# Patient Record
Sex: Female | Born: 1960 | Race: White | Hispanic: No | Smoking: Never smoker
Health system: Southern US, Community
[De-identification: ages and names within clinical notes are randomized; demographics above are authoritative.]

## PROBLEM LIST (undated history)

## (undated) DIAGNOSIS — R7303 Prediabetes: Secondary | ICD-10-CM

## (undated) DIAGNOSIS — K219 Gastro-esophageal reflux disease without esophagitis: Secondary | ICD-10-CM

## (undated) DIAGNOSIS — I10 Essential (primary) hypertension: Secondary | ICD-10-CM

## (undated) DIAGNOSIS — E079 Disorder of thyroid, unspecified: Secondary | ICD-10-CM

## (undated) DIAGNOSIS — R011 Cardiac murmur, unspecified: Secondary | ICD-10-CM

## (undated) DIAGNOSIS — R0600 Dyspnea, unspecified: Secondary | ICD-10-CM

## (undated) DIAGNOSIS — M1990 Unspecified osteoarthritis, unspecified site: Secondary | ICD-10-CM

## (undated) DIAGNOSIS — T7840XA Allergy, unspecified, initial encounter: Secondary | ICD-10-CM

## (undated) HISTORY — DX: Unspecified osteoarthritis, unspecified site: M19.90

## (undated) HISTORY — DX: Essential (primary) hypertension: I10

## (undated) HISTORY — PX: TONSILLECTOMY AND ADENOIDECTOMY: SHX28

## (undated) HISTORY — DX: Disorder of thyroid, unspecified: E07.9

## (undated) HISTORY — DX: Allergy, unspecified, initial encounter: T78.40XA

## (undated) HISTORY — PX: THYROIDECTOMY: SHX17

---

## 1999-05-12 ENCOUNTER — Other Ambulatory Visit: Admission: RE | Admit: 1999-05-12 | Discharge: 1999-05-12 | Payer: Self-pay | Admitting: Obstetrics and Gynecology

## 1999-06-07 ENCOUNTER — Emergency Department (HOSPITAL_COMMUNITY): Admission: EM | Admit: 1999-06-07 | Discharge: 1999-06-08 | Payer: Self-pay | Admitting: Emergency Medicine

## 2000-05-28 ENCOUNTER — Other Ambulatory Visit: Admission: RE | Admit: 2000-05-28 | Discharge: 2000-05-28 | Payer: Self-pay | Admitting: Obstetrics and Gynecology

## 2001-06-03 ENCOUNTER — Other Ambulatory Visit: Admission: RE | Admit: 2001-06-03 | Discharge: 2001-06-03 | Payer: Self-pay | Admitting: Obstetrics and Gynecology

## 2002-06-04 ENCOUNTER — Other Ambulatory Visit: Admission: RE | Admit: 2002-06-04 | Discharge: 2002-06-04 | Payer: Self-pay | Admitting: Obstetrics and Gynecology

## 2003-06-22 ENCOUNTER — Other Ambulatory Visit: Admission: RE | Admit: 2003-06-22 | Discharge: 2003-06-22 | Payer: Self-pay | Admitting: Obstetrics and Gynecology

## 2006-10-23 ENCOUNTER — Ambulatory Visit: Payer: Self-pay | Admitting: Internal Medicine

## 2006-10-23 LAB — CONVERTED CEMR LAB
ALT: 24 units/L (ref 0–40)
Albumin: 3.2 g/dL — ABNORMAL LOW (ref 3.5–5.2)
Alkaline Phosphatase: 47 units/L (ref 39–117)
BUN: 6 mg/dL (ref 6–23)
Basophils Absolute: 0 10*3/uL (ref 0.0–0.1)
Bilirubin Urine: NEGATIVE
Bilirubin, Direct: 0.1 mg/dL (ref 0.0–0.3)
CO2: 30 meq/L (ref 19–32)
Chloride: 106 meq/L (ref 96–112)
Cholesterol: 211 mg/dL (ref 0–200)
GFR calc Af Amer: 138 mL/min
GFR calc non Af Amer: 114 mL/min
Glucose, Bld: 104 mg/dL — ABNORMAL HIGH (ref 70–99)
HDL: 50.8 mg/dL (ref >39.0)
Hemoglobin, Urine: NEGATIVE
Hemoglobin: 13.7 g/dL (ref 12.0–15.0)
Ketones, ur: NEGATIVE mg/dL
Monocytes Relative: 7 % (ref 3.0–11.0)
Neutro Abs: 3.7 10*3/uL (ref 1.4–7.7)
Neutrophils Relative %: 54.2 % (ref 43.0–77.0)
Nitrite: NEGATIVE
Platelets: 404 10*3/uL — ABNORMAL HIGH (ref 150–400)
RBC: 4.7 M/uL (ref 3.87–5.11)
RDW: 12.2 % (ref 11.5–14.6)
Specific Gravity, Urine: 1.01 (ref 1.000–1.03)
Total Bilirubin: 0.6 mg/dL (ref 0.3–1.2)
Total Protein: 6.6 g/dL (ref 6.0–8.3)
Urine Glucose: NEGATIVE mg/dL
Urobilinogen, UA: 0.2 (ref 0.0–1.0)
VLDL: 32 mg/dL (ref 0–40)

## 2006-10-30 ENCOUNTER — Other Ambulatory Visit: Admission: RE | Admit: 2006-10-30 | Discharge: 2006-10-30 | Payer: Self-pay | Admitting: Internal Medicine

## 2006-10-30 ENCOUNTER — Encounter: Payer: Self-pay | Admitting: Internal Medicine

## 2006-10-30 ENCOUNTER — Ambulatory Visit: Payer: Self-pay | Admitting: Internal Medicine

## 2006-12-14 ENCOUNTER — Ambulatory Visit (HOSPITAL_COMMUNITY): Admission: RE | Admit: 2006-12-14 | Discharge: 2006-12-14 | Payer: Self-pay | Admitting: Family Medicine

## 2006-12-14 ENCOUNTER — Ambulatory Visit: Payer: Self-pay | Admitting: Family Medicine

## 2006-12-31 ENCOUNTER — Ambulatory Visit: Payer: Self-pay | Admitting: Internal Medicine

## 2007-05-12 ENCOUNTER — Telehealth: Payer: Self-pay | Admitting: Internal Medicine

## 2007-05-12 DIAGNOSIS — E041 Nontoxic single thyroid nodule: Secondary | ICD-10-CM | POA: Insufficient documentation

## 2007-05-26 ENCOUNTER — Encounter: Payer: Self-pay | Admitting: Internal Medicine

## 2007-06-02 ENCOUNTER — Telehealth: Payer: Self-pay | Admitting: Internal Medicine

## 2007-09-22 ENCOUNTER — Telehealth: Payer: Self-pay | Admitting: Internal Medicine

## 2007-09-30 ENCOUNTER — Encounter: Payer: Self-pay | Admitting: Internal Medicine

## 2007-10-07 ENCOUNTER — Telehealth (INDEPENDENT_AMBULATORY_CARE_PROVIDER_SITE_OTHER): Payer: Self-pay | Admitting: *Deleted

## 2007-11-24 ENCOUNTER — Encounter: Payer: Self-pay | Admitting: Internal Medicine

## 2007-11-26 ENCOUNTER — Telehealth: Payer: Self-pay | Admitting: Internal Medicine

## 2007-12-09 ENCOUNTER — Encounter: Payer: Self-pay | Admitting: Internal Medicine

## 2007-12-14 ENCOUNTER — Encounter: Payer: Self-pay | Admitting: Internal Medicine

## 2007-12-16 ENCOUNTER — Other Ambulatory Visit: Admission: RE | Admit: 2007-12-16 | Discharge: 2007-12-16 | Payer: Self-pay | Admitting: Family Medicine

## 2008-01-21 ENCOUNTER — Encounter: Admission: RE | Admit: 2008-01-21 | Discharge: 2008-01-21 | Payer: Self-pay | Admitting: Internal Medicine

## 2008-03-03 ENCOUNTER — Encounter (INDEPENDENT_AMBULATORY_CARE_PROVIDER_SITE_OTHER): Payer: Self-pay | Admitting: Family Medicine

## 2008-03-03 ENCOUNTER — Ambulatory Visit: Admission: RE | Admit: 2008-03-03 | Discharge: 2008-03-03 | Payer: Self-pay | Admitting: Family Medicine

## 2008-03-03 ENCOUNTER — Ambulatory Visit: Payer: Self-pay | Admitting: *Deleted

## 2008-07-21 ENCOUNTER — Encounter: Admission: RE | Admit: 2008-07-21 | Discharge: 2008-07-21 | Payer: Self-pay | Admitting: Internal Medicine

## 2008-12-23 ENCOUNTER — Other Ambulatory Visit: Admission: RE | Admit: 2008-12-23 | Discharge: 2008-12-23 | Payer: Self-pay | Admitting: Family Medicine

## 2009-09-27 ENCOUNTER — Encounter: Admission: RE | Admit: 2009-09-27 | Discharge: 2009-09-27 | Payer: Self-pay | Admitting: Internal Medicine

## 2009-12-27 ENCOUNTER — Other Ambulatory Visit: Admission: RE | Admit: 2009-12-27 | Discharge: 2009-12-27 | Payer: Self-pay | Admitting: Family Medicine

## 2010-05-28 ENCOUNTER — Encounter: Payer: Self-pay | Admitting: Internal Medicine

## 2010-09-19 NOTE — Assessment & Plan Note (Signed)
[facility]                                 ON-CALL NOTE   NAME:SCHULTZGennell, How                         MRN:          [id number]  DATE:12/14/2006                            DOB:          13-Sep-1960    I was able to contact Ms. Orama this evening, and advise  her  of her  results regarding  the MRI of the neck.  Patient was advised to keep up  the appointment with Dr. Debby Bud on Thursday.  She is to follow up sooner  if she has any problems.  Patient states that she anticipates  rescheduling the appointment due to some conflict with her job, but she  will follow up at her earliest convenience.     Leanne Chang, M.D.  Electronically Signed    LA/MedQ  DD: 12/14/2006  DT: 12/15/2006  Job #: [id number]

## 2010-09-19 NOTE — Assessment & Plan Note (Signed)
[facility]                                 ON-CALL NOTE   NAME:SCHULTZSheyli, Horwitz                       MRN:          [id number]  DATE:12/14/2006                            DOB:          [birth year]    PRIMARY CARE PHYSICIAN:  Dr. Debby Bud.   Ms. Surles is a patient that I saw during the Saturday clinic, please  see the note for further details. She was noted to have a mass on the  left side of her neck. She does have a history of thyroid goiter, but  MRI was ordered to rule out any other pathology. I called [facility] and was able to obtain to radiology report that was read by  Dr. Pecolia Ades. The report was read to me by Brett Canales at radiology and it  stated the following: Thyroid goiter with small scattered lymph node,  but no abscess, cellulitis, mass noted. Mild cervical degenerative  changes.   I will attempt to contact Ms. Behl to advise her of the MRI report.  We will have her follow up as scheduled next week with Dr. Debby Bud. She  is to follow up sooner if she has any significant discomfort or  worsening symptoms.     Leanne Chang, M.D.  Electronically Signed    LA/MedQ  DD: 12/14/2006  DT: 12/15/2006  Job #: [id number]

## 2010-09-19 NOTE — Assessment & Plan Note (Signed)
[facility]                           PRIMARY CARE OFFICE NOTE   NAME:Tara Hill, Tara Hill                       MRN:          [id number]  DATE:10/30/2006                            DOB:          07/10/1960    REASON FOR VISIT:  Tara Hill is a 50 year old woman who presents to  establish for ongoing continuity of care.  She has no active complaints  at today's visit and wishes a general physical examination.   PAST MEDICAL HISTORY:  1. Patient has had the usual childhood diseases.  2. Hay fever and allergies to dust, mold and cat dander.  3. Heart murmur with normal 2-dimensional echocardiogram in 2002.  4. Hypertension.  5. Menstrual headaches, averaging two per year.  6. Hyperthyroid disease.   PAST SURGICAL HISTORY:  Tonsillectomy, remote.  No other surgeries  noted.   GYN HISTORY:  Patient is a gravida 0, para 0.   CURRENT MEDICATIONS:  1. Yasmin daily.  2. Hydrochlorothiazide 25 mg daily.  3. Zyrtec p.r.n.  4. Protonix 40 mg p.r.n.  5. Axert 12.5 mg p.r.n. headaches.  6. Skelaxin 800 mg as needed.   FAMILY HISTORY:  Alcoholism in her father who is deceased.  Father also  with arthritis.  Mother with lymphoma, deceased.  Two brothers in good  health, one sister with fibroid tumors, one great aunt with breast  cancer.  Secondary kinship positive for diabetes, hypertension, stroke  and heart disease.   SOCIAL HISTORY:  The patient has had two years of college.  She is a  Estate agent.  She was married for four years, divorced and  remains single.  She has no children.   HABITS:  The patient's hobbies include reading, making cards.  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  Tobacco:  None.  Alcohol:  None.   REVIEW OF SYSTEMS:  Negative for any CONSTITUTIONAL, CARDIOVASCULAR,  RESPIRATORY, GI or GU problems.   PHYSICAL EXAMINATION:  VITAL SIGNS:  Temperature 97.2, blood pressure  136/94, pulse 100, weight 199.  GENERAL APPEARANCE:  Heavy-set, Caucasian woman who looks her stated age  in no acute distress.  HEENT:  Normocephalic, atraumatic.  EAC's and tympanic membranes were  unremarkable.  Oropharynx with native dentition in good repair with  braces intact.  Posterior pharynx was clear.  Buccal membranes were  clear.  Conjunctivae and sclerae were clear. Pupils equal, round,  reactive.  NECK:  Supple without thyromegaly, nodes with no adenopathy noted in the  cervical or supraclavicular regions.  CHEST:  No deformities or abnormalities were noted.  BREAST:  Skin was normal.  Nipples without discharge.  Patient has  fibrocystic type changes with no fixed mass lesion or abnormalities.  There is no axillary adenopathy.  LUNGS:  Clear with no rales, wheezes or rhonchi.  CARDIOVASCULAR:  2+ radial pulses.  No JVD.  No carotid bruits.  She had  a quiet precordium with regular rate and rhythm without murmurs, rubs or  gallops.  ABDOMEN:  Soft, no guarding or rebound. No organosplenomegaly was noted.  PELVIC:  NEFG, BUS  normal.  Vaginal mucosa appeared normal.  The patient  has a retroflexed uterus.  Pap scraping and endocervical brushings were  performed.  Bimanual exam with single digit revealed no cervical motion  tenderness.  There was no adnexal enlargement or abnormality although  the exam was hindered by girth.  Rectovaginal exam is normal.  Posterior  vaginal wall without abnormalities.  Stool was Guaiac negative.  EXTREMITIES: Without cyanosis, clubbing or edema.  No deformities were  noted.   LABORATORY DATA:  Hemoglobin 13.7 grams, white count 6,800 with normal  differential.  Chemistries were unremarkable with a serum glucose of  104.  Liver functions were normal.  Cholesterol 211.  Triglycerides 158.  HDL was excellent at 50.8.  LDL was 139.9.  Thyroid functions normal  with TSH of 0.96.  Urinalysis was negative.   ASSESSMENT/PLAN:  1. Thyroid disease.  Patient with what sounds like  multinodular goiter      with normal thyroid function without medication.  Patient is to be      scheduled at [facility] for followup thyroid ultrasound      to assess her goiter for progression.  2. Hypertension.  The patient's blood pressure is borderline      controlled at 136/94.  We will have her monitor her blood pressures      at home.  She will continue on hydrochlorothiazide.  3. Allergies stable with no medication at this time.  4. Menstrual headaches, stable with rare problems and does respond to      Axert.  Patient does have a medical supply of the same.  5. Herpes simplex virus.  The patient does have occasional cold sores.      She was given a prescription for Valtrex to take 1 gram b.i.d. for      7 days for outbreaks.  6. Health maintenance.  Normal examination at today's visit.  The      patient will be notified as to the results of her Pap smear.  She      evidently did have a mammogram in the past for baseline and would      be due for followup mammogram at her discretion.   In summary, this is a very pleasant woman with medical problems that are  mild and adequately managed at this time.  She is given refill  prescriptions as well as having prescriptions sent for 90  day supply to [facility].  She will be notified of her Pap smear results as  noted.  She will return to see me on a p.r.n. basis.     Rosalyn Gess Norins, MD  Electronically Signed    MEN/MedQ  DD: 10/31/2006  DT: 10/31/2006  Job #: [id number]

## 2011-06-21 ENCOUNTER — Ambulatory Visit (INDEPENDENT_AMBULATORY_CARE_PROVIDER_SITE_OTHER): Payer: 59 | Admitting: Physician Assistant

## 2011-06-21 VITALS — BP 119/82 | HR 91 | Temp 98.5°F | Resp 18 | Ht 64.5 in | Wt 218.8 lb

## 2011-06-21 DIAGNOSIS — D7289 Other specified disorders of white blood cells: Secondary | ICD-10-CM

## 2011-06-21 DIAGNOSIS — R05 Cough: Secondary | ICD-10-CM

## 2011-06-21 DIAGNOSIS — J019 Acute sinusitis, unspecified: Secondary | ICD-10-CM

## 2011-06-21 DIAGNOSIS — R059 Cough, unspecified: Secondary | ICD-10-CM

## 2011-06-21 DIAGNOSIS — I10 Essential (primary) hypertension: Secondary | ICD-10-CM

## 2011-06-21 DIAGNOSIS — J40 Bronchitis, not specified as acute or chronic: Secondary | ICD-10-CM

## 2011-06-21 LAB — POCT CBC
Granulocyte percent: 65.8 %G (ref 37–80)
MCH, POC: 26.3 pg — AB (ref 27–31.2)
MCHC: 32.7 g/dL (ref 31.8–35.4)
MCV: 80.4 fL (ref 80–97)
MPV: 8.5 fL (ref 0–99.8)
RDW, POC: 14.8 %
WBC: 14.6 10*3/uL — AB (ref 4.6–10.2)

## 2011-06-21 MED ORDER — HYDROCODONE-HOMATROPINE 5-1.5 MG/5ML PO SYRP: ORAL_SOLUTION | ORAL | Status: AC

## 2011-06-21 MED ORDER — CEFDINIR 300 MG PO CAPS: 300.0000 mg | ORAL_CAPSULE | Freq: Two times a day (BID) | ORAL | Status: AC

## 2011-06-21 NOTE — Progress Notes (Signed)
Patient ID: Tara Hill MRN: [id number], DOB: [birth year], 51 y.o. Date of Encounter: 06/21/2011, 6:32 PM  Primary Physician: No primary provider on file.  Chief Complaint:  Chief Complaint  Patient presents with  . Otalgia    drainage pain into face  . Sore Throat    started Monday  . chest congestion  . Cough    HPI: 51 y.o. year old female presents with 1 week history of sore throat, nasal congestion, sinus pressure, ear fullness, and cough. Has post nasal drip at baseline secondary to her allergies.  Afebrile/ nochills through out. Cough is productive of green sputum, and worse at night when she lays down. Nasal congestion Hearing is normal. Sinus pressure along right maxillary sinus. Appetite down secondary to the sore throat. Pushing fluids. Medications: Zyrtec No GI symptoms Positive sick contacts: No No Recent antibiotics  Does have an enlarged thyroid gland. Followed by Deboraha Sprang. Normal studies per patient report.  No leg trauma, sedentary periods, h/o cancer, or tobacco use.  Past Medical History  Diagnosis Date  . Hypertension   . Migraine   . Allergic rhinitis      Home Meds: Prior to Admission medications   Medication Sig Start Date End Date Taking? Authorizing Provider  atenolol (TENORMIN) 25 MG tablet Take 25 mg by mouth daily.   Yes Historical Provider, MD  cetirizine (ZYRTEC) 10 MG tablet Take 10 mg by mouth daily.   Yes Historical Provider, MD  drospirenone-ethinyl estradiol (YASMIN,ZARAH,SYEDA) 3-0.03 MG tablet Take 1 tablet by mouth daily.   Yes Historical Provider, MD  hydrochlorothiazide (HYDRODIURIL) 25 MG tablet Take 25 mg by mouth daily.   Yes Historical Provider, MD  omeprazole (PRILOSEC) 20 MG capsule Take 20 mg by mouth daily.   Yes Historical Provider, MD    Allergies: No Known Allergies  History   Social History  . Marital Status: Divorced    Spouse Name: N/A    Number of Children: N/A  . Years of Education: N/A    Occupational History  . Not on file.   Social History Main Topics  . Smoking status: Never Smoker   . Smokeless tobacco: Not on file  . Alcohol Use: Not on file  . Drug Use: Not on file  . Sexually Active: Not on file   Other Topics Concern  . Not on file   Social History Narrative  . No narrative on file     Review of Systems: Constitutional: negative for chills, fever, night sweats or weight changes Cardiovascular: negative for chest pain or palpitations Respiratory: negative for hemoptysis, wheezing, or shortness of breath Abdominal: negative for abdominal pain, nausea, vomiting or diarrhea Dermatological: negative for rash Neurologic: negative for headache   Physical Exam: Blood pressure 119/82, pulse 91, temperature 98.5 F (36.9 C), temperature source Oral, resp. rate 18, height 5' 4.5" (1.638 m), weight 218 lb 12.8 oz (99.247 kg), last menstrual period 05/31/2011., Body mass index is 36.98 kg/(m^2). General: Well developed, well nourished, in no acute distress. Head: Normocephalic, atraumatic, eyes without discharge, sclera non-icteric, nares are congested. Bilateral auditory canals clear, TM's are without perforation, pearly grey with reflective cone of light bilaterally. Bilateral serous effusion behind TM's. Oral cavity moist, dentition normal. Posterior pharynx with post nasal drip and mild erythema. No peritonsillar abscess or tonsillar exudate. Neck: Supple. No thyromegaly. Full ROM. No lymphadenopathy. Lungs: Coarse lung sounds bilaterally without wheezes, rales, or rhonchi. Breathing is unlabored. Heart: RRR with S1 S2. No murmurs, rubs, or gallops  appreciated. Msk:  Strength and tone normal for age. Extremities: No clubbing or cyanosis. No edema. Neuro: Alert and oriented X 3. Moves all extremities spontaneously. CNII-XII grossly in tact. Psych:  Responds to questions appropriately with a normal affect.   Labs: Results for orders placed in visit on 06/21/11   POCT CBC      Component Value Range   WBC 14.6 (*) 4.6 - 10.2 (K/uL)   Lymph, poc 4.2 (*) 0.6 - 3.4    POC LYMPH PERCENT 29.1  10 - 50 (%L)   MID (cbc) 0.7  0 - 0.9    POC MID % 5.1  0 - 12 (%M)   POC Granulocyte 9.6 (*) 2 - 6.9    Granulocyte percent 65.8  37 - 80 (%G)   RBC 4.79  4.04 - 5.48 (M/uL)   Hemoglobin 12.6  12.2 - 16.2 (g/dL)   HCT, POC 65.7  84.6 - 47.9 (%)   MCV 80.4  80 - 97 (fL)   MCH, POC 26.3 (*) 27 - 31.2 (pg)   MCHC 32.7  31.8 - 35.4 (g/dL)   RDW, POC 96.2     Platelet Count, POC 467 (*) 142 - 424 (K/uL)   MPV 8.5  0 - 99.8 (fL)     ASSESSMENT AND PLAN:  51 y.o. year old female with sinobronchitis, cough, and leukocytosis. -Omnicef 300 mg 1 po bid #20 no RF  -Hycodan #4oz 1 tsp po q 4-6 hours prn cough no RF SED -Mucinex -Tylenol/Motrin prn -Rest/fluids -RTC precautions -RTC 3 days if no improvement -Have patient call with an update in 24-48 hours   Signed, Eula Listen, PA-C 06/21/2011 6:32 PM

## 2011-09-27 ENCOUNTER — Other Ambulatory Visit: Payer: Self-pay | Admitting: Internal Medicine

## 2011-09-27 DIAGNOSIS — E049 Nontoxic goiter, unspecified: Secondary | ICD-10-CM

## 2011-10-08 ENCOUNTER — Other Ambulatory Visit: Payer: Self-pay

## 2011-10-09 ENCOUNTER — Ambulatory Visit
Admission: RE | Admit: 2011-10-09 | Discharge: 2011-10-09 | Disposition: A | Discharge status: Home / Self Care | Payer: 59 | Source: Ambulatory Visit | Attending: Internal Medicine | Admitting: Internal Medicine

## 2011-10-09 DIAGNOSIS — E049 Nontoxic goiter, unspecified: Secondary | ICD-10-CM

## 2012-03-25 ENCOUNTER — Telehealth: Payer: Self-pay | Admitting: Internal Medicine

## 2012-03-25 NOTE — Telephone Encounter (Signed)
S/W PT IN REF TO NP APPT. ON 04/16/12@1 :30 REFERRING DR. Katrinka Blazing DX-ELEVATED WBC MAILED NP PACKET

## 2012-03-26 ENCOUNTER — Telehealth: Payer: Self-pay | Admitting: Internal Medicine

## 2012-03-26 NOTE — Telephone Encounter (Signed)
C/D 03/26/12 for appt. 04/16/12

## 2012-04-16 ENCOUNTER — Other Ambulatory Visit: Payer: 59 | Admitting: Lab

## 2012-04-16 ENCOUNTER — Ambulatory Visit (HOSPITAL_BASED_OUTPATIENT_CLINIC_OR_DEPARTMENT_OTHER): Payer: 59 | Admitting: Internal Medicine

## 2012-04-16 ENCOUNTER — Encounter: Payer: Self-pay | Admitting: Internal Medicine

## 2012-04-16 ENCOUNTER — Ambulatory Visit: Payer: 59

## 2012-04-16 ENCOUNTER — Ambulatory Visit (HOSPITAL_BASED_OUTPATIENT_CLINIC_OR_DEPARTMENT_OTHER): Payer: 59 | Admitting: Lab

## 2012-04-16 ENCOUNTER — Ambulatory Visit: Payer: 59 | Admitting: Internal Medicine

## 2012-04-16 DIAGNOSIS — D72829 Elevated white blood cell count, unspecified: Secondary | ICD-10-CM

## 2012-04-16 LAB — CBC WITH DIFFERENTIAL/PLATELET
Eosinophils Absolute: 0.2 10*3/uL (ref 0.0–0.5)
HCT: 37.5 % (ref 34.8–46.6)
MCH: 27.7 pg (ref 25.1–34.0)
MCV: 79.5 fL (ref 79.5–101.0)
MONO%: 5.2 % (ref 0.0–14.0)
NEUT#: 5.9 10*3/uL (ref 1.5–6.5)
NEUT%: 63.1 % (ref 38.4–76.8)
WBC: 9.3 10*3/uL (ref 3.9–10.3)
lymph#: 2.8 10*3/uL (ref 0.9–3.3)

## 2012-04-16 NOTE — Progress Notes (Signed)
Checked in new pt with no financial concerns. °

## 2012-04-16 NOTE — Progress Notes (Signed)
[facility] [phone]   [phone]  CONSULT NOTE  REASON FOR CONSULTATION:  51 years old white female with leukocytosis.  HPI Tara Hill is a 51 y.o. female was past medical history significant for hypertension, migraine headache, allergic rhinitis, hyperparathyroidism and plantar fasciitis. The patient was seen recently by her primary care physician Dr. Merri Brunette for routine evaluation and treatment of allergic rhinitis. CBC on 02/19/2012 showed elevated white blood count of 12.1 with absolute lymphocyte count of 3500 and absolute neutrophil count of 7900. Repeat CBC on 03/17/2012 showed persistent elevation of the total white blood count at 11.9 with slightly elevated absolute neutrophil count of 3100 and elevated absolute lymphocyte count of 7800. The patient had some dental work at that time. She was also on treatment with Zyrtec and Flonase nasal spray for allergic rhinitis. She she is also on  Oral birth control pill for several years. Low crit her previous records the patient had normal white blood count in August of 2011 as well as August of 2012. She denied having any recent infection and specifically no urinary tract infection or upper respiratory infection. She denied having any bleeding issues. The patient denied having any palpable lymphadenopathy but has enlarged thyroid gland. She denied having any significant weight loss or night sweats. She denied having any chest pain, shortness breath, cough or hemoptysis. Family history is unremarkable for leukemia. Her mother had lymphoma and father had COPD. The patient is divorced and has no children. She works at [employer]. She has no history of smoking, alcohol or drug abuse. @SFHPI @  Past Medical History  Diagnosis Date  . Hypertension   . Migraine   . Allergic rhinitis     Past Surgical History  Procedure Date  . Tonsillectomy and adenoidectomy     No family history on  file.  Social History History  Substance Use Topics  . Smoking status: Never Smoker   . Smokeless tobacco: Not on file  . Alcohol Use: Not on file    No Known Allergies  Current Outpatient Prescriptions  Medication Sig Dispense Refill  . atenolol (TENORMIN) 25 MG tablet Take 25 mg by mouth daily.      . Calcium Carbonate-Vitamin D (CALTRATE 600+D) 600-400 MG-UNIT per chew tablet Chew 1 tablet by mouth daily.      . cetirizine (ZYRTEC) 10 MG tablet Take 10 mg by mouth daily.      . drospirenone-ethinyl estradiol (YASMIN,ZARAH,SYEDA) 3-0.03 MG tablet Take 1 tablet by mouth daily.      . fish oil-omega-3 fatty acids 1000 MG capsule Take 2 g by mouth daily.      . hydrochlorothiazide (HYDRODIURIL) 25 MG tablet Take 25 mg by mouth daily.      . Multiple Vitamin (MULTIVITAMIN) tablet Take 1 tablet by mouth daily.      Marland Kitchen omeprazole (PRILOSEC) 20 MG capsule Take 20 mg by mouth daily.      . fluticasone (FLONASE) 50 MCG/ACT nasal spray Place 2 sprays into the nose Daily.        Review of Systems  A comprehensive review of systems was negative.  Physical Exam  JYN:WGNFA, healthy, no distress, well nourished and well developed SKIN: skin color, texture, turgor are normal HEAD: Normocephalic, No masses, lesions, tenderness or abnormalities EYES: normal, PERRLA EARS: External ears normal OROPHARYNX:no exudate and no erythema  NECK: supple, no adenopathy LYMPH:  no palpable lymphadenopathy, no hepatosplenomegaly BREAST:not examined LUNGS: clear to auscultation  HEART: regular rate & rhythm and no murmurs ABDOMEN:abdomen soft, non-tender, obese, normal bowel sounds and no masses or organomegaly BACK: Back symmetric, no curvature. EXTREMITIES:no joint deformities, effusion, or inflammation, no edema, no skin discoloration  NEURO: alert & oriented x 3 with fluent speech, no focal motor/sensory deficits  PERFORMANCE STATUS: ECOG 0  LABORATORY DATA: Lab Results  Component Value Date     WBC 9.3 04/16/2012   HGB 13.1 04/16/2012   HCT 37.5 04/16/2012   MCV 79.5 04/16/2012   PLT 347 04/16/2012      Chemistry      Component Value Date/Time   NA 141 10/23/2006 0736   K 4.2 10/23/2006 0736   CL 106 10/23/2006 0736   CO2 30 10/23/2006 0736   BUN 6 10/23/2006 0736   CREATININE 0.6 10/23/2006 0736      Component Value Date/Time   CALCIUM 8.9 10/23/2006 0736   ALKPHOS 47 10/23/2006 0736   AST 21 10/23/2006 0736   ALT 24 10/23/2006 0736   BILITOT 0.6 10/23/2006 0736       RADIOGRAPHIC STUDIES: No results found.  ASSESSMENT: This is a very pleasant 51 years old white female is in today for evaluation of leukocytosis which is completely resolved at this time. This is most likely reactive in nature secondary to recent dental procedure as well as treatment with Flonase. The patient is currently asymptomatic and her total white blood count is within the normal range.   PLAN: I have a lengthy discussion with the patient today about her condition. I don't see a need for any further intervention at this point. I recommended for the patient to continue her routine followup visit with her primary care physician Dr. Merri Brunette. I would be happy to see you in the future if needed or if there is any persistent elevation of her white blood count. The patient agreed to the current plan.  All questions were answered. The patient knows to call the clinic with any problems, questions or concerns. We can certainly see the patient much sooner if necessary.  Thank you so much for allowing me to participate in the care of Tara Hill. I will continue to follow up the patient with you and assist in her care.  I spent 25 minutes counseling the patient face to face. The total time spent in the appointment was 50 minutes.  Chancy Smigiel K. 04/16/2012, 10:47 AM

## 2012-04-16 NOTE — Patient Instructions (Signed)
Your CBC today showed normal white blood count. Followup with your primary care physician

## 2012-09-19 ENCOUNTER — Other Ambulatory Visit: Payer: Self-pay | Admitting: Internal Medicine

## 2012-09-19 DIAGNOSIS — E049 Nontoxic goiter, unspecified: Secondary | ICD-10-CM

## 2012-10-01 ENCOUNTER — Other Ambulatory Visit: Payer: 59

## 2012-10-07 ENCOUNTER — Ambulatory Visit
Admission: RE | Admit: 2012-10-07 | Discharge: 2012-10-07 | Disposition: A | Discharge status: Home / Self Care | Payer: 59 | Source: Ambulatory Visit | Attending: Internal Medicine | Admitting: Internal Medicine

## 2012-10-07 DIAGNOSIS — E049 Nontoxic goiter, unspecified: Secondary | ICD-10-CM

## 2012-11-22 ENCOUNTER — Ambulatory Visit (INDEPENDENT_AMBULATORY_CARE_PROVIDER_SITE_OTHER): Payer: 59 | Admitting: Family Medicine

## 2012-11-22 VITALS — BP 124/72 | HR 88 | Temp 98.1°F | Resp 12 | Ht 65.0 in | Wt 228.0 lb

## 2012-11-22 DIAGNOSIS — R350 Frequency of micturition: Secondary | ICD-10-CM

## 2012-11-22 DIAGNOSIS — R319 Hematuria, unspecified: Secondary | ICD-10-CM

## 2012-11-22 DIAGNOSIS — N390 Urinary tract infection, site not specified: Secondary | ICD-10-CM

## 2012-11-22 DIAGNOSIS — R300 Dysuria: Secondary | ICD-10-CM

## 2012-11-22 LAB — POCT URINALYSIS DIPSTICK
Bilirubin, UA: NEGATIVE
Ketones, UA: NEGATIVE
Protein, UA: >300
pH, UA: 7

## 2012-11-22 LAB — POCT UA - MICROSCOPIC ONLY
Mucus, UA: NEGATIVE
Yeast, UA: NEGATIVE

## 2012-11-22 MED ORDER — NITROFURANTOIN MONOHYD MACRO 100 MG PO CAPS: 100.0000 mg | ORAL_CAPSULE | Freq: Two times a day (BID) | ORAL | Status: DC

## 2012-11-22 MED ORDER — PHENAZOPYRIDINE HCL 200 MG PO TABS: 200.0000 mg | ORAL_TABLET | Freq: Three times a day (TID) | ORAL | Status: DC | PRN

## 2012-11-22 NOTE — Progress Notes (Signed)
[facility]:  Office Visit  Chief Complaint:  Chief Complaint  Patient presents with  . Urinary Frequency    symptoms began 3 days ago - difficult to urinate, frequency & blood noticed today  . Hematuria  . Dysuria    HPI: Tara Hill is a 52 y.o. female who complains of urinary retention, frequency and urgency with a tinge of pink when she wipes x 3 days.  She has some pelvic pain and dysuria . No flank pain. She was on Ocella and was recently taken to see if she is going through menopause This is the first time she has had UTI sxs Not sexually active Denies vaginal dryness Deneis n/v/fevers/chills  Past Medical History  Diagnosis Date  . Hypertension   . Migraine   . Allergic rhinitis   . Arthritis   . Thyroid disease    Past Surgical History  Procedure Laterality Date  . Tonsillectomy and adenoidectomy     History   Social History  . Marital Status: Divorced    Spouse Name: N/A    Number of Children: N/A  . Years of Education: N/A   Social History Main Topics  . Smoking status: Never Smoker   . Smokeless tobacco: Never Used  . Alcohol Use: None  . Drug Use: None  . Sexually Active: None   Other Topics Concern  . None   Social History Narrative  . None   Family History  Problem Relation Age of Onset  . Cancer Mother   . COPD Father    No Known Allergies Prior to Admission medications   Medication Sig Start Date End Date Taking? Authorizing Provider  atenolol (TENORMIN) 25 MG tablet Take 25 mg by mouth daily.   Yes Historical Provider, MD  cetirizine (ZYRTEC) 10 MG tablet Take 10 mg by mouth daily.   Yes Historical Provider, MD  hydrochlorothiazide (HYDRODIURIL) 25 MG tablet Take 25 mg by mouth daily.   Yes Historical Provider, MD  metaxalone (SKELAXIN) 800 MG tablet Take 800 mg by mouth daily as needed for pain.   Yes Historical Provider, MD  omeprazole (PRILOSEC) 20 MG capsule Take 20 mg by mouth daily.   Yes Historical  Provider, MD  Calcium Carbonate-Vitamin D (CALTRATE 600+D) 600-400 MG-UNIT per chew tablet Chew 1 tablet by mouth daily.    Historical Provider, MD  drospirenone-ethinyl estradiol (YASMIN,ZARAH,SYEDA) 3-0.03 MG tablet Take 1 tablet by mouth daily.    Historical Provider, MD  fish oil-omega-3 fatty acids 1000 MG capsule Take 2 g by mouth daily.    Historical Provider, MD  fluticasone (FLONASE) 50 MCG/ACT nasal spray Place 2 sprays into the nose Daily. 03/01/12   Historical Provider, MD  Multiple Vitamin (MULTIVITAMIN) tablet Take 1 tablet by mouth daily.    Historical Provider, MD     ROS: The patient denies fevers, chills, night sweats, unintentional weight loss, chest pain, palpitations, wheezing, dyspnea on exertion, nausea, vomiting,  melena, numbness, weakness, or tingling.   All other systems have been reviewed and were otherwise negative with the exception of those mentioned in the HPI and as above.    PHYSICAL EXAM: Filed Vitals:   11/22/12 1246  BP: 124/72  Pulse: 88  Temp: 98.1 F (36.7 C)  Resp: 12   Filed Vitals:   11/22/12 1246  Height: 5\' 5"  (1.651 m)  Weight: 228 lb (103.42 kg)   Body mass index is 37.94 kg/(m^2).  General: Alert, no acute distress HEENT:  Normocephalic, atraumatic,  oropharynx patent.  Cardiovascular:  Regular rate and rhythm, no rubs murmurs or gallops.  No Carotid bruits, radial pulse intact. No pedal edema.  Respiratory: Clear to auscultation bilaterally.  No wheezes, rales, or rhonchi.  No cyanosis, no use of accessory musculature GI: No organomegaly, abdomen is soft and non-tender, positive bowel sounds.  No masses. Skin: No rashes. Neurologic: Facial musculature symmetric. Psychiatric: Patient is appropriate throughout our interaction. Lymphatic: No cervical lymphadenopathy Musculoskeletal: Gait intact. No CVA tenderness   LABS: Results for orders placed in visit on 11/22/12  POCT UA - MICROSCOPIC ONLY      Result Value Range   WBC,  Ur, HPF, POC 8-12     RBC, urine, microscopic tntc     Bacteria, U Microscopic trace     Mucus, UA neg     Epithelial cells, urine per micros 0-2     Crystals, Ur, HPF, POC neg     Casts, Ur, LPF, POC neg     Yeast, UA neg    POCT URINALYSIS DIPSTICK      Result Value Range   Color, UA yellow     Clarity, UA cloudy     Glucose, UA neg     Bilirubin, UA neg     Ketones, UA neg     Spec Grav, UA >=1.030     Blood, UA large     pH, UA 7.0     Protein, UA >300     Urobilinogen, UA 0.2     Nitrite, UA positive     Leukocytes, UA small (1+)       EKG/XRAY:   Primary read interpreted by Dr. Conley Rolls at [facility].   ASSESSMENT/PLAN: Encounter Diagnoses  Name Primary?  . Frequency of urination Yes  . Hematuria   . Dysuria   . UTI (urinary tract infection)     Urine cx pending Rx macrobid Rx Pydridium Gross sideeffects, risk and benefits, and alternatives of medications d/w patient. Patient is aware that all medications have potential sideeffects and we are unable to predict every sideeffect or drug-drug interaction that may occur. F/u prn Advise that if hematuira persist after UTI treated then will need to investigate further   LE, [name], DO 11/22/2012 1:35 PM

## 2012-11-22 NOTE — Patient Instructions (Addendum)
Urinary Tract Infection  Urinary tract infections (UTIs) can develop anywhere along your urinary tract. Your urinary tract is your body's drainage system for removing wastes and extra water. Your urinary tract includes two kidneys, two ureters, a bladder, and a urethra. Your kidneys are a pair of bean-shaped organs. Each kidney is about the size of your fist. They are located below your ribs, one on each side of your spine.  CAUSES  Infections are caused by microbes, which are microscopic organisms, including fungi, viruses, and bacteria. These organisms are so small that they can only be seen through a microscope. Bacteria are the microbes that most commonly cause UTIs.  SYMPTOMS   Symptoms of UTIs may vary by age and gender of the patient and by the location of the infection. Symptoms in young women typically include a frequent and intense urge to urinate and a painful, burning feeling in the bladder or urethra during urination. Older women and men are more likely to be tired, shaky, and weak and have muscle aches and abdominal pain. A fever may mean the infection is in your kidneys. Other symptoms of a kidney infection include pain in your back or sides below the ribs, nausea, and vomiting.  DIAGNOSIS  To diagnose a UTI, your caregiver will ask you about your symptoms. Your caregiver also will ask to provide a urine sample. The urine sample will be tested for bacteria and white blood cells. White blood cells are made by your body to help fight infection.  TREATMENT   Typically, UTIs can be treated with medication. Because most UTIs are caused by a bacterial infection, they usually can be treated with the use of antibiotics. The choice of antibiotic and length of treatment depend on your symptoms and the type of bacteria causing your infection.  HOME CARE INSTRUCTIONS   If you were prescribed antibiotics, take them exactly as your caregiver instructs you. Finish the medication even if you feel better after you  have only taken some of the medication.   Drink enough water and fluids to keep your urine clear or pale yellow.   Avoid caffeine, tea, and carbonated beverages. They tend to irritate your bladder.   Empty your bladder often. Avoid holding urine for long periods of time.   Empty your bladder before and after sexual intercourse.   After a bowel movement, women should cleanse from front to back. Use each tissue only once.  SEEK MEDICAL CARE IF:    You have back pain.   You develop a fever.   Your symptoms do not begin to resolve within 3 days.  SEEK IMMEDIATE MEDICAL CARE IF:    You have severe back pain or lower abdominal pain.   You develop chills.   You have nausea or vomiting.   You have continued burning or discomfort with urination.  MAKE SURE YOU:    Understand these instructions.   Will watch your condition.   Will get help right away if you are not doing well or get worse.  Document Released: 01/31/2005 Document Revised: 10/23/2011 Document Reviewed: 06/01/2011  ExitCare Patient Information 2014 [employer].

## 2012-11-24 LAB — URINE CULTURE: Colony Count: >=100000

## 2012-12-12 ENCOUNTER — Telehealth: Payer: Self-pay

## 2012-12-12 ENCOUNTER — Other Ambulatory Visit: Payer: Self-pay | Admitting: Family Medicine

## 2012-12-12 DIAGNOSIS — N390 Urinary tract infection, site not specified: Secondary | ICD-10-CM

## 2012-12-12 MED ORDER — CIPROFLOXACIN HCL 500 MG PO TABS: 500.0000 mg | ORAL_TABLET | Freq: Two times a day (BID) | ORAL | Status: DC

## 2012-12-12 NOTE — Telephone Encounter (Signed)
Patient saw Dr. Conley Rolls 11/22/12 for a UTI. Patient believes it has come back now and was told to call and let Dr. Conley Rolls know; so she can call in her Rx for her UTI.  Best number: [phone]

## 2012-12-12 NOTE — Telephone Encounter (Signed)
Spoke with patient. E faxed in Cipro Gross sideeffects, risk and benefits, and alternatives of medications d/w patient. Patient is aware that all medications have potential sideeffects and we are unable to predict every sideeffect or drug-drug interaction that may occur.

## 2013-03-02 ENCOUNTER — Other Ambulatory Visit: Payer: Self-pay | Admitting: Family Medicine

## 2013-03-02 ENCOUNTER — Other Ambulatory Visit (HOSPITAL_COMMUNITY)
Admission: RE | Admit: 2013-03-02 | Discharge: 2013-03-02 | Disposition: A | Discharge status: Home / Self Care | Payer: 59 | Source: Ambulatory Visit | Attending: Family Medicine | Admitting: Family Medicine

## 2013-03-02 DIAGNOSIS — Z124 Encounter for screening for malignant neoplasm of cervix: Secondary | ICD-10-CM | POA: Insufficient documentation

## 2013-04-14 ENCOUNTER — Telehealth: Payer: Self-pay | Admitting: Radiology

## 2013-04-14 ENCOUNTER — Ambulatory Visit: Payer: 59

## 2013-04-14 ENCOUNTER — Ambulatory Visit (INDEPENDENT_AMBULATORY_CARE_PROVIDER_SITE_OTHER): Payer: 59 | Admitting: Internal Medicine

## 2013-04-14 VITALS — BP 126/78 | HR 81 | Temp 98.1°F | Resp 18 | Ht 66.0 in | Wt 240.4 lb

## 2013-04-14 DIAGNOSIS — M549 Dorsalgia, unspecified: Secondary | ICD-10-CM

## 2013-04-14 LAB — POCT URINALYSIS DIPSTICK
Blood, UA: NEGATIVE
Glucose, UA: NEGATIVE
Spec Grav, UA: 1.02
Urobilinogen, UA: 0.2
pH, UA: 7

## 2013-04-14 LAB — POCT UA - MICROSCOPIC ONLY
Bacteria, U Microscopic: NEGATIVE
Casts, Ur, LPF, POC: NEGATIVE
Mucus, UA: NEGATIVE

## 2013-04-14 NOTE — Telephone Encounter (Signed)
1. 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  2. There is a 6 mm nodular density within the right mid to upper  lung. Correlation with PA and lateral chest radiograph as well as  potential chest CT is advised.  These results will be called to the ordering clinician or  representative by the Radiologist Assistant, and communication  documented in the PACS Dashboard. .  Report of thoracic spine xray

## 2013-04-14 NOTE — Progress Notes (Signed)
   Subjective:    Patient ID: Tara Hill, female    DOB: [birth year], 52 y.o.   MRN: [id number]  HPI CO pain mid thorax, hurts to twist and radiates to sides, no rash, no burning pain, no weakness or numbness. No remembered trauma, is presently seeing spine specialist for LS problems and bulging discs. No fever, nite sweats, fatigue, anorexia, weight loss. Last visit here in July had Ecoli uti, will ck ccua.  Review of Systems     Objective:   Physical Exam  Vitals reviewed. Constitutional: She is oriented to person, place, and time. She appears well-developed and well-nourished. No distress.  HENT:  Head: Normocephalic.  Eyes: EOM are normal.  Neck: Normal range of motion. Neck supple.  Pulmonary/Chest: Effort normal.  Musculoskeletal: She exhibits tenderness.       Thoracic back: She exhibits tenderness, bony tenderness, pain and spasm. She exhibits normal range of motion, no swelling, no edema, no deformity, no laceration and normal pulse.  Neurological: She is alert and oriented to person, place, and time. She has normal strength. No cranial nerve deficit or sensory deficit. Coordination and gait abnormal.  Skin: No rash noted.  Psychiatric: She has a normal mood and affect. Her behavior is normal.   ccua  Results for orders placed in visit on 04/14/13  POCT UA - MICROSCOPIC ONLY      Result Value Range   WBC, Ur, HPF, POC neg     RBC, urine, microscopic nge     Bacteria, U Microscopic neg     Mucus, UA neg     Epithelial cells, urine per micros 0-1     Crystals, Ur, HPF, POC neg     Casts, Ur, LPF, POC neg     Yeast, UA neg    POCT URINALYSIS DIPSTICK      Result Value Range   Color, UA yellow     Clarity, UA clear     Glucose, UA neg     Bilirubin, UA neg     Ketones, UA neg     Spec Grav, UA 1.020     Blood, UA neg     pH, UA 7.0     Protein, UA neg     Urobilinogen, UA 0.2     Nitrite, UA neg     Leukocytes, UA Negative       [facility] reading (PRIMARY)  by  Dr Perrin Maltese DDD and spondylosis       Assessment & Plan:  Thoracic pain Take xrs and share with your spine specialist Tylenol/skelaxin prn

## 2013-04-14 NOTE — Patient Instructions (Signed)
Thoracic Strain °You have injured the muscles or tendons that attach to the upper part of your back behind your chest. This injury is called a thoracic strain, thoracic sprain, or mid-back strain.  °CAUSES  °The cause of thoracic strain varies. A less severe injury involves pulling a muscle or tendon without tearing it. A more severe injury involves tearing (rupturing) a muscle or tendon. With less severe injuries, there may be little loss of strength. Sometimes, there are breaks (fractures) in the bones to which the muscles are attached. These fractures are rare, unless there was a direct hit (trauma) or you have weak bones due to osteoporosis or age. Longstanding strains may be caused by overuse or improper form during certain movements. Obesity can also increase your risk for back injuries. Sudden strains may occur due to injury or not warming up properly before exercise. Often, there is no obvious cause for a thoracic strain. °SYMPTOMS  °The main symptom is pain, especially with movement, such as during exercise. °DIAGNOSIS  °Your caregiver can usually tell what is wrong by taking an X-ray and doing a physical exam. °TREATMENT  °· Physical therapy may be helpful for recovery. Your caregiver can give you exercises to do or refer you to a physical therapist after your pain improves. °· After your pain improves, strengthening and conditioning programs appropriate for your sport or occupation may be helpful. °· Always warm up before physical activities or athletics. Stretching after physical activity may also help. °· Certain over-the-counter medicines may also help. Ask your caregiver if there are medicines that would help you. °If this is your first thoracic strain injury, proper care and proper healing time before starting activities should prevent long-term problems. Torn ligaments and tendons require as long to heal as broken bones. Average healing times may be only 1 week for a mild strain. For torn muscles  and tendons, healing time may be up to 6 weeks to 2 months. °HOME CARE INSTRUCTIONS  °· Apply ice to the injured area. Ice massages may also be used as directed. °· Put ice in a plastic bag. °· Place a towel between your skin and the bag. °· Leave the ice on for 15-20 minutes, 03-04 times a day, for the first 2 days. °· Only take over-the-counter or prescription medicines for pain, discomfort, or fever as directed by your caregiver. °· Keep your appointments for physical therapy if this was prescribed. °· Use wraps and back braces as instructed. °SEEK IMMEDIATE MEDICAL CARE IF:  °· You have an increase in bruising, swelling, or pain. °· Your pain has not improved with medicines. °· You develop new shortness of breath, chest pain, or fever. °· Problems seem to be getting worse rather than better. °MAKE SURE YOU:  °· Understand these instructions. °· Will watch your condition. °· Will get help right away if you are not doing well or get worse. °Document Released: 07/14/2003 Document Revised: 07/16/2011 Document Reviewed: 06/09/2010 °ExitCare® Patient Information ©2014 [employer]. ° °

## 2013-08-26 ENCOUNTER — Other Ambulatory Visit: Payer: Self-pay | Admitting: Internal Medicine

## 2013-08-26 DIAGNOSIS — E042 Nontoxic multinodular goiter: Secondary | ICD-10-CM

## 2013-08-28 ENCOUNTER — Ambulatory Visit (INDEPENDENT_AMBULATORY_CARE_PROVIDER_SITE_OTHER): Payer: 59 | Admitting: Physician Assistant

## 2013-08-28 VITALS — BP 130/82 | HR 94 | Temp 98.3°F | Ht 64.0 in | Wt 248.0 lb

## 2013-08-28 DIAGNOSIS — H9209 Otalgia, unspecified ear: Secondary | ICD-10-CM

## 2013-08-28 DIAGNOSIS — J329 Chronic sinusitis, unspecified: Secondary | ICD-10-CM

## 2013-08-28 DIAGNOSIS — M26609 Unspecified temporomandibular joint disorder, unspecified side: Secondary | ICD-10-CM

## 2013-08-28 DIAGNOSIS — H9201 Otalgia, right ear: Secondary | ICD-10-CM

## 2013-08-28 MED ORDER — IPRATROPIUM BROMIDE 0.03 % NA SOLN: 2.0000 | Freq: Two times a day (BID) | NASAL | Status: DC

## 2013-08-28 MED ORDER — GUAIFENESIN ER 1200 MG PO TB12: 1.0000 | ORAL_TABLET | Freq: Two times a day (BID) | ORAL | Status: DC | PRN

## 2013-08-28 MED ORDER — BENZONATATE 100 MG PO CAPS: 100.0000 mg | ORAL_CAPSULE | Freq: Three times a day (TID) | ORAL | Status: DC | PRN

## 2013-08-28 MED ORDER — AMOXICILLIN-POT CLAVULANATE 875-125 MG PO TABS: 1.0000 | ORAL_TABLET | Freq: Two times a day (BID) | ORAL | Status: DC

## 2013-08-28 MED ORDER — FLUTICASONE PROPIONATE 50 MCG/ACT NA SUSP: 2.0000 | Freq: Every day | NASAL | Status: DC

## 2013-08-28 NOTE — Patient Instructions (Signed)
Get plenty of rest and drink at least 64 ounces of water daily. Continue the zyrtec. Use the Flonase daily, for maintenance. Use the Atrovent (ipratropium) as rescue.

## 2013-08-28 NOTE — Progress Notes (Signed)
Subjective:    Patient ID: Tara Hill, female    DOB: 06-01-1960, 53 y.o.   MRN: [id number]   PCP: Allean FoundSMITH,CANDACE THIELE, MD  Chief Complaint  Patient presents with  . Nasal Congestion    sore throat, nasal congestion, right ear pain that goes down into her right jaw.  pain x 1.5 wks   congestion x 4 days    Medications, allergies, past medical history, surgical history, family history, social history and problem list reviewed and updated.  HPI  Sore throat and RIGHT ear pain x 10 days.  Congestion, cough, drainage, sneezing x 4 days.  No fever, chills, N/V/D. No unexplained myalgias/arthralgias.  Feels weak and tired, "trying to fight this stuff, I guess." Slight headaches, all over.  History of similar symptoms, and some hearing loss.  Last saw ENT specialist 1-2 years ago.  Ran out of Flonase, given for similar symptoms previously.  Review of Systems As above.    Objective:   Physical Exam  Vitals reviewed. Constitutional: She is oriented to person, place, and time. Vital signs are normal. She appears well-developed and well-nourished. No distress.  BP 130/82  Pulse 94  Temp(Src) 98.3 F (36.8 C) (Oral)  Ht 5\' 4"  (1.626 m)  Wt 248 lb (112.492 kg)  BMI 42.55 kg/m2  SpO2 95%  LMP 07/28/2013   HENT:  Head: Normocephalic and atraumatic.    Right Ear: Hearing, tympanic membrane, external ear and ear canal normal. Tympanic membrane is not injected, not scarred, not perforated, not erythematous, not retracted and not bulging. No middle ear effusion.  Left Ear: Hearing, tympanic membrane, external ear and ear canal normal. Tympanic membrane is not injected, not scarred, not perforated, not erythematous, not retracted and not bulging.  No middle ear effusion.  Nose: Mucosal edema and rhinorrhea present.  No foreign bodies. Right sinus exhibits maxillary sinus tenderness. Right sinus exhibits no frontal sinus tenderness. Left sinus exhibits no maxillary sinus  tenderness and no frontal sinus tenderness.  Mouth/Throat: Uvula is midline, oropharynx is clear and moist and mucous membranes are normal. No uvula swelling. No oropharyngeal exudate.  Tenderness and popping with palpation and ROM of the TMJ on the RIGHT.  Eyes: Conjunctivae and EOM are normal. Pupils are equal, round, and reactive to light. Right eye exhibits no discharge. Left eye exhibits no discharge. No scleral icterus.  Neck: Trachea normal, normal range of motion and full passive range of motion without pain. Neck supple. No mass and no thyromegaly present.  Cardiovascular: Normal rate, regular rhythm and normal heart sounds.   Pulmonary/Chest: Effort normal and breath sounds normal.  Lymphadenopathy:       Head (right side): No submandibular, no tonsillar, no preauricular, no posterior auricular and no occipital adenopathy present.       Head (left side): No submandibular, no tonsillar, no preauricular and no occipital adenopathy present.    She has no cervical adenopathy.       Right: No supraclavicular adenopathy present.       Left: No supraclavicular adenopathy present.  Neurological: She is alert and oriented to person, place, and time. She has normal strength. No cranial nerve deficit or sensory deficit.  Skin: Skin is warm, dry and intact. No rash noted.  Psychiatric: She has a normal mood and affect. Her speech is normal and behavior is normal.          Assessment & Plan:  1. Otalgia of right ear 2. Sinusitis - amoxicillin-clavulanate (AUGMENTIN) 875-125 MG per  tablet; Take 1 tablet by mouth 2 (two) times daily.  Dispense: 20 tablet; Refill: 0 - ipratropium (ATROVENT) 0.03 % nasal spray; Place 2 sprays into both nostrils 2 (two) times daily.  Dispense: 30 mL; Refill: 0 - fluticasone (FLONASE) 50 MCG/ACT nasal spray; Place 2 sprays into both nostrils daily.  Dispense: 16 g; Refill: 12 - benzonatate (TESSALON) 100 MG capsule; Take 1-2 capsules (100-200 mg total) by mouth 3  (three) times daily as needed for cough.  Dispense: 40 capsule; Refill: 0 - Guaifenesin (MUCINEX MAXIMUM STRENGTH) 1200 MG TB12; Take 1 tablet (1,200 mg total) by mouth every 12 (twelve) hours as needed.  Dispense: 14 tablet; Refill: 1  3. TMJ (temporomandibular joint syndrome) Already under evaluation/treatment of this issue with her PCP.   Fernande Brashelle S. Devaney Segers, PA-C Physician Assistant-Certified [facility]

## 2013-09-05 ENCOUNTER — Telehealth: Payer: Self-pay

## 2013-09-05 ENCOUNTER — Ambulatory Visit (INDEPENDENT_AMBULATORY_CARE_PROVIDER_SITE_OTHER): Payer: 59 | Admitting: Family Medicine

## 2013-09-05 VITALS — BP 132/78 | HR 75 | Temp 97.6°F | Resp 18 | Ht 64.5 in | Wt 242.0 lb

## 2013-09-05 DIAGNOSIS — R221 Localized swelling, mass and lump, neck: Secondary | ICD-10-CM

## 2013-09-05 DIAGNOSIS — E049 Nontoxic goiter, unspecified: Secondary | ICD-10-CM

## 2013-09-05 DIAGNOSIS — M26629 Arthralgia of temporomandibular joint, unspecified side: Secondary | ICD-10-CM

## 2013-09-05 DIAGNOSIS — E010 Iodine-deficiency related diffuse (endemic) goiter: Secondary | ICD-10-CM

## 2013-09-05 DIAGNOSIS — R220 Localized swelling, mass and lump, head: Secondary | ICD-10-CM

## 2013-09-05 DIAGNOSIS — K118 Other diseases of salivary glands: Secondary | ICD-10-CM

## 2013-09-05 LAB — CBC WITH DIFFERENTIAL/PLATELET
Basophils Absolute: 0 10*3/uL (ref 0.0–0.1)
Basophils Relative: 0 % (ref 0–1)
Eosinophils Absolute: 0.1 10*3/uL (ref 0.0–0.7)
Eosinophils Relative: 1 % (ref 0–5)
HCT: 36.6 % (ref 36.0–46.0)
Hemoglobin: 12.2 g/dL (ref 12.0–15.0)
Lymphocytes Relative: 21 % (ref 12–46)
Lymphs Abs: 2.4 10*3/uL (ref 0.7–4.0)
MCH: 25.5 pg — ABNORMAL LOW (ref 26.0–34.0)
MCHC: 33.3 g/dL (ref 30.0–36.0)
MCV: 76.4 fL — ABNORMAL LOW (ref 78.0–100.0)
Monocytes Absolute: 0.7 10*3/uL (ref 0.1–1.0)
Monocytes Relative: 6 % (ref 3–12)
Neutro Abs: 8.4 10*3/uL — ABNORMAL HIGH (ref 1.7–7.7)
Neutrophils Relative %: 72 % (ref 43–77)
Platelets: 425 10*3/uL — ABNORMAL HIGH (ref 150–400)
RBC: 4.79 MIL/uL (ref 3.87–5.11)
RDW: 15.2 % (ref 11.5–15.5)
WBC: 11.6 10*3/uL — ABNORMAL HIGH (ref 4.0–10.5)

## 2013-09-05 MED ORDER — TRAMADOL HCL 50 MG PO TABS: 50.0000 mg | ORAL_TABLET | Freq: Three times a day (TID) | ORAL | Status: DC | PRN

## 2013-09-05 NOTE — Patient Instructions (Signed)
Parotitis °Parotitis is soreness and swelling (inflammation) of one or both parotid glands. The parotid glands produce saliva. They are located on each side of the face, below and in front of the earlobes. The saliva produced comes out of tiny openings (ducts) inside the cheeks. In most cases, parotitis goes away over time or with treatment. If your parotitis is caused by certain long-term (chronic) diseases, it may come back again.  °CAUSES  °Parotitis can be caused by: °· Viral infections. Mumps is one viral infection that can cause parotitis. °· Bacterial infections. °· Blockage of the salivary ducts due to a salivary stone. °· Narrowing of the salivary ducts. °· Swelling of the salivary ducts. °· Dehydration. °· Autoimmune conditions, such as sarcoidosis or Sjogren's syndrome. °· Air from activities such as scuba diving, glass blowing, or playing an instrument (rare). °· Human immunodeficiency virus (HIV) or acquired immunodeficiency syndrome (AIDS). °· Tuberculosis. °SYMPTOMS  °· The ears may appear to be pushed up and out from their normal position. °· Redness (erythema) of the skin over the parotid glands. °· Pain and tenderness over the parotid glands. °· Swelling in the parotid gland area. °· Yellowish-white fluid (pus) coming from the ducts inside the cheeks. °· Dry mouth. °· Bad taste in the mouth. °DIAGNOSIS  °Your caregiver may determine that you have parotitis based on your symptoms and a physical exam. A sample of fluid may also be taken from the parotid gland and tested to find the cause of your infection. X-rays or computed tomography (CT) scans may be taken if your caregiver thinks you might have a salivary stone blocking your salivary duct. °TREATMENT  °Treatment varies depending upon the cause of your parotitis. If your parotitis is caused by mumps, no treatment is needed. The condition will go away on its own after 7 to 10 days. In other cases, treatment may include: °· Antibiotics if your  infection was caused by bacteria. °· Pain medicines. °· Gland massage. °· Eating sour candy to increase your saliva production. °· Removal of salivary stones. Your caregiver may flush stones out with fluids or remove them with tweezers. °· Surgery to remove the parotid glands. °HOME CARE INSTRUCTIONS  °· If you were given antibiotics, take them as directed. Finish them even if you start to feel better. °· Put warm compresses on the sore area. °· Only take over-the-counter or prescription medicines for pain, discomfort, or fever as directed by your caregiver. °· Drink enough fluids to keep your urine clear or pale yellow. °SEEK IMMEDIATE MEDICAL CARE IF:  °· You have increasing pain or swelling that is not controlled with medicine. °· You have a fever. °MAKE SURE YOU: °· Understand these instructions. °· Will watch your condition. °· Will get help right away if you are not doing well or get worse. °Document Released: 10/13/2001 Document Revised: 07/16/2011 Document Reviewed: 03/19/2011 °ExitCare® Patient Information ©2014 [employer]. ° °

## 2013-09-05 NOTE — Progress Notes (Signed)
   Subjective:    Patient ID: Tara Hill, female    DOB: Mar 04, 1961, 53 y.o.   MRN: [id number]  HPI 53 y.o. Female presents for otalgia of the right ear . Saw Chelle Jeffery last week and states she  was told it was due to here TMJ. Is having trouble sleep at night as well and pain into her jaw and some swelling.   Has history of enlarged thyroid. Sees Dr. Sharl MaKerr for this .    Review of Systems     Objective:   Physical Exam NAD Tender TMJ Swollen right  Cheek with palpably enlarged parotid Neck:  Diffusely enlarged thyroid bilaterally., no adenopathy Skin: no change Oroph: no acute changes TM's:  normal Results for orders placed in visit on 04/14/13  POCT UA - MICROSCOPIC ONLY      Result Value Ref Range   WBC, Ur, HPF, POC neg     RBC, urine, microscopic nge     Bacteria, U Microscopic neg     Mucus, UA neg     Epithelial cells, urine per micros 0-1     Crystals, Ur, HPF, POC neg     Casts, Ur, LPF, POC neg     Yeast, UA neg    POCT URINALYSIS DIPSTICK      Result Value Ref Range   Color, UA yellow     Clarity, UA clear     Glucose, UA neg     Bilirubin, UA neg     Ketones, UA neg     Spec Grav, UA 1.020     Blood, UA neg     pH, UA 7.0     Protein, UA neg     Urobilinogen, UA 0.2     Nitrite, UA neg     Leukocytes, UA Negative         Assessment & Plan:  TMJ arthralgia - Plan: CBC with Differential, traMADol (ULTRAM) 50 MG tablet  Thyromegaly  Parotid mass - Plan: CBC with Differential, traMADol (ULTRAM) 50 MG tablet  Signed, Elvina SidleKurt Lauenstein, MD

## 2013-09-05 NOTE — Telephone Encounter (Signed)
Pt states that she was seen today and was prescribed tramadol. She states that she is still in pain and the rx does not seem to be working for her. Best# [phone]

## 2013-09-06 NOTE — Telephone Encounter (Signed)
Tramadol is not effective in her pain relief. Can we call something in stronger?

## 2013-09-07 NOTE — Telephone Encounter (Signed)
Please see if you can get patient in to ENT today.  If not, I'll write for a stronger pain med.

## 2013-10-06 ENCOUNTER — Other Ambulatory Visit (HOSPITAL_COMMUNITY): Payer: Self-pay | Admitting: Family Medicine

## 2013-10-06 ENCOUNTER — Ambulatory Visit (HOSPITAL_COMMUNITY): Payer: 59 | Attending: Family Medicine | Admitting: Radiology

## 2013-10-06 DIAGNOSIS — I10 Essential (primary) hypertension: Secondary | ICD-10-CM | POA: Insufficient documentation

## 2013-10-06 DIAGNOSIS — I059 Rheumatic mitral valve disease, unspecified: Secondary | ICD-10-CM

## 2013-10-06 NOTE — Progress Notes (Signed)
Echocardiogram performed.  

## 2013-11-17 ENCOUNTER — Ambulatory Visit
Admission: RE | Admit: 2013-11-17 | Discharge: 2013-11-17 | Disposition: A | Discharge status: Home / Self Care | Payer: 59 | Source: Ambulatory Visit | Attending: Internal Medicine | Admitting: Internal Medicine

## 2013-11-17 ENCOUNTER — Other Ambulatory Visit: Payer: 59

## 2013-11-17 ENCOUNTER — Encounter (INDEPENDENT_AMBULATORY_CARE_PROVIDER_SITE_OTHER): Payer: Self-pay

## 2013-11-17 DIAGNOSIS — E042 Nontoxic multinodular goiter: Secondary | ICD-10-CM

## 2014-05-20 ENCOUNTER — Other Ambulatory Visit (HOSPITAL_COMMUNITY)
Admission: RE | Admit: 2014-05-20 | Discharge: 2014-05-20 | Disposition: A | Discharge status: Home / Self Care | Payer: 59 | Source: Ambulatory Visit | Attending: Family Medicine | Admitting: Family Medicine

## 2014-05-20 ENCOUNTER — Other Ambulatory Visit: Payer: Self-pay | Admitting: Family Medicine

## 2014-05-20 DIAGNOSIS — Z01419 Encounter for gynecological examination (general) (routine) without abnormal findings: Secondary | ICD-10-CM | POA: Diagnosis not present

## 2014-05-24 LAB — CYTOLOGY - PAP

## 2014-09-11 ENCOUNTER — Ambulatory Visit (INDEPENDENT_AMBULATORY_CARE_PROVIDER_SITE_OTHER): Payer: 59 | Admitting: Family Medicine

## 2014-09-11 VITALS — BP 116/78 | HR 76 | Temp 98.0°F | Resp 18 | Ht 65.0 in | Wt 235.4 lb

## 2014-09-11 DIAGNOSIS — R0982 Postnasal drip: Secondary | ICD-10-CM

## 2014-09-11 DIAGNOSIS — J069 Acute upper respiratory infection, unspecified: Secondary | ICD-10-CM | POA: Diagnosis not present

## 2014-09-11 DIAGNOSIS — J329 Chronic sinusitis, unspecified: Secondary | ICD-10-CM

## 2014-09-11 DIAGNOSIS — R05 Cough: Secondary | ICD-10-CM | POA: Diagnosis not present

## 2014-09-11 DIAGNOSIS — J029 Acute pharyngitis, unspecified: Secondary | ICD-10-CM

## 2014-09-11 DIAGNOSIS — R059 Cough, unspecified: Secondary | ICD-10-CM

## 2014-09-11 LAB — POCT RAPID STREP A (OFFICE): Rapid Strep A Screen: NEGATIVE

## 2014-09-11 MED ORDER — IPRATROPIUM BROMIDE 0.03 % NA SOLN: 2.0000 | Freq: Three times a day (TID) | NASAL | Status: DC

## 2014-09-11 NOTE — Patient Instructions (Signed)
It looks like you have a cold.  Rest, drink plenty of fluids and use OTC medications as needed (mucinex, tylenol, etc). If you are not feeling better in the next few days please let me know-Sooner if worse.

## 2014-09-11 NOTE — Progress Notes (Signed)
[facility] [address], [place] [id number] [phone]  Date:  09/11/2014   Name:  Tara Hill   DOB:  29-Feb-1961   MRN:  [id number]  PCP:  Allean FoundSMITH,CANDACE THIELE, MD    Chief Complaint: Sore Throat; Nasal Congestion; and Cough   History of Present Illness:  Tara Hill is a 54 y.o. very pleasant female patient who presents with the following:  She notes a ST, sinus congestion and some cough.  This started yesterday am.   She has not noted a fever, no chills or aches.   No GI symptoms.   She has been exposed to illness at her job [employer] she has tried some mucinex OTC.    Patient Active Problem List   Diagnosis Date Noted  . TMJ (temporomandibular joint syndrome) 08/28/2013  . Back pain 04/14/2013  . Leukocytosis 04/16/2012  . Hypertension   . THYROID NODULE 05/12/2007    Past Medical History  Diagnosis Date  . Hypertension   . Migraine   . Allergic rhinitis   . Arthritis   . Thyroid disease   . Allergy     Past Surgical History  Procedure Laterality Date  . Tonsillectomy and adenoidectomy      History  Substance Use Topics  . Smoking status: Never Smoker   . Smokeless tobacco: Never Used  . Alcohol Use: No    Family History  Problem Relation Age of Onset  . Cancer Mother 5960    lymphoma  . COPD Father   . Thyroid disease Sister   . Hypertension Brother   . Sleep apnea Brother     No Known Allergies  Medication list has been reviewed and updated.  Current Outpatient Prescriptions on File Prior to Visit  Medication Sig Dispense Refill  . amoxicillin-clavulanate (AUGMENTIN) 875-125 MG per tablet Take 1 tablet by mouth 2 (two) times daily. 20 tablet 0  . atenolol (TENORMIN) 25 MG tablet Take 25 mg by mouth daily.    . benzonatate (TESSALON) 100 MG capsule Take 1-2 capsules (100-200 mg total) by mouth 3 (three) times daily as needed for cough. 40 capsule 0  . cetirizine (ZYRTEC) 10 MG tablet Take 10 mg by mouth daily.     . fluticasone (FLONASE) 50 MCG/ACT nasal spray Place 2 sprays into both nostrils daily. 16 g 12  . Guaifenesin (MUCINEX MAXIMUM STRENGTH) 1200 MG TB12 Take 1 tablet (1,200 mg total) by mouth every 12 (twelve) hours as needed. 14 tablet 1  . hydrochlorothiazide (HYDRODIURIL) 25 MG tablet Take 25 mg by mouth daily.    Marland Kitchen. ipratropium (ATROVENT) 0.03 % nasal spray Place 2 sprays into both nostrils 2 (two) times daily. 30 mL 0  . metaxalone (SKELAXIN) 800 MG tablet Take 800 mg by mouth daily as needed for pain.    Marland Kitchen. omeprazole (PRILOSEC) 20 MG capsule Take 20 mg by mouth daily.    . traMADol (ULTRAM) 50 MG tablet Take 1 tablet (50 mg total) by mouth every 8 (eight) hours as needed. 30 tablet 0   No current facility-administered medications on file prior to visit.    Review of Systems:  As per HPI- otherwise negative.   Physical Examination: Filed Vitals:   09/11/14 0905  BP: 116/78  Pulse: 76  Temp: 98 F (36.7 C)  Resp: 18   Filed Vitals:   09/11/14 0905  Height: 5\' 5"  (1.651 m)  Weight: 235 lb 6.4 oz (106.777 kg)   Body mass  index is 39.17 kg/(m^2). Ideal Body Weight: Weight in (lb) to have BMI = 25: 149.9  GEN: WDWN, NAD, Non-toxic, A & O x 3, obese HEENT: Atraumatic, Normocephalic. Neck supple.  No LAD.  Bilateral TM wnl, oropharynx normal.  PEERL,EOMI.   Large goiter- pt is aware of this Ears and Nose: No external deformity. CV: RRR, No M/G/R. No JVD. No thrill. No extra heart sounds. PULM: CTA B, no wheezes, crackles, rhonchi. No retractions. No resp. distress. No accessory muscle use. EXTR: No c/c/e NEURO Normal gait.  PSYCH: Normally interactive. Conversant. Not depressed or anxious appearing.  Calm demeanor.   Results for orders placed or performed in visit on 09/11/14  POCT rapid strep A  Result Value Ref Range   Rapid Strep A Screen Negative Negative    Assessment and Plan: Sore throat - Plan: POCT rapid strep A  Viral URI  Cough  Post-nasal drainage -  Plan: ipratropium (ATROVENT) 0.03 % nasal spray  atrovent nasal as needed for PND. Otherwise discussed supportive care for her URI   Signed Abbe AmsterdamJessica Kemani Demarais, MD

## 2014-09-19 ENCOUNTER — Ambulatory Visit (INDEPENDENT_AMBULATORY_CARE_PROVIDER_SITE_OTHER): Payer: 59

## 2014-09-19 ENCOUNTER — Ambulatory Visit (INDEPENDENT_AMBULATORY_CARE_PROVIDER_SITE_OTHER): Payer: 59 | Admitting: Family Medicine

## 2014-09-19 VITALS — BP 116/70 | HR 84 | Temp 98.4°F | Resp 18 | Ht 65.0 in | Wt 234.8 lb

## 2014-09-19 DIAGNOSIS — R059 Cough, unspecified: Secondary | ICD-10-CM

## 2014-09-19 DIAGNOSIS — R05 Cough: Secondary | ICD-10-CM

## 2014-09-19 DIAGNOSIS — R222 Localized swelling, mass and lump, trunk: Secondary | ICD-10-CM

## 2014-09-19 DIAGNOSIS — J189 Pneumonia, unspecified organism: Secondary | ICD-10-CM | POA: Diagnosis not present

## 2014-09-19 DIAGNOSIS — R229 Localized swelling, mass and lump, unspecified: Secondary | ICD-10-CM

## 2014-09-19 LAB — POCT CBC
GRANULOCYTE PERCENT: 70.8 % (ref 37–80)
HEMATOCRIT: 39.9 % (ref 37.7–47.9)
Hemoglobin: 12.8 g/dL (ref 12.2–16.2)
LYMPH, POC: 3 (ref 0.6–3.4)
MCH, POC: 23.9 pg — AB (ref 27–31.2)
MCHC: 32.1 g/dL (ref 31.8–35.4)
MCV: 74.5 fL — AB (ref 80–97)
MID (CBC): 0.9 (ref 0–0.9)
MPV: 7.1 fL (ref 0–99.8)
POC Granulocyte: 9.4 — AB (ref 2–6.9)
POC LYMPH PERCENT: 22.4 %L (ref 10–50)
POC MID %: 6.8 %M (ref 0–12)
Platelet Count, POC: 482 10*3/uL — AB (ref 142–424)
RBC: 5.36 M/uL (ref 4.04–5.48)
RDW, POC: 16.1 %
WBC: 13.3 10*3/uL — AB (ref 4.6–10.2)

## 2014-09-19 LAB — POCT SEDIMENTATION RATE: POCT SED RATE: 31 mm/hr — AB (ref 0–22)

## 2014-09-19 MED ORDER — GUAIFENESIN ER 1200 MG PO TB12: 1.0000 | ORAL_TABLET | Freq: Two times a day (BID) | ORAL | Status: DC | PRN

## 2014-09-19 MED ORDER — AZITHROMYCIN 500 MG PO TABS: 500.0000 mg | ORAL_TABLET | Freq: Every day | ORAL | Status: DC

## 2014-09-19 MED ORDER — BENZONATATE 100 MG PO CAPS: 100.0000 mg | ORAL_CAPSULE | Freq: Three times a day (TID) | ORAL | Status: DC | PRN

## 2014-09-19 NOTE — Progress Notes (Addendum)
Subjective:  This chart was scribed for Tara SorensonEva Joyanne Eddinger MD, by Veverly FellsHatice Demirci,scribe, at [facility].  This patient was seen in room 10  and the patient's care was started at 9:37 AM.    Patient ID: Tara Hill, female    DOB: February 29, 1961, 54 y.o.   MRN: [id number] Chief Complaint  Patient presents with  . Cough    productive   . Back Pain    notices when coughing     HPI  HPI Comments: Tara Hill is a 54 y.o. female who presents to the [facility] complaining of a productive cough (clear/yellowish mucous) onset a week ago.  She has associated symptoms of chest pain/ back pain when she coughs.  This back pain is different from her chronic back pain and thinks it came on from the constant cough.  Patient has been using Mucin Ex and a cough medication.  She denies any loss of sleep and says the over the counter medication helps her with rest.   Patient does not smoke.  Her PCP is Dr. Merri Brunetteandace Smith. Patients endocrinologist is Dr. Sharl MaKerr. She has no other complaints today.  ----- Saw Dr. Dallas Schimkeopeland 1 week ago for URI symptoms X1 day at that point.  Strep test was negative.  Supportive care recommended with as needed Atrovent nasal spray.  She has a pmh of allergic rhinitis and hypertension as well as chronic back pain. No imaging for her back in our system.   Past Medical History  Diagnosis Date  . Hypertension   . Migraine   . Allergic rhinitis   . Arthritis   . Thyroid disease   . Allergy     Current Outpatient Prescriptions on File Prior to Visit  Medication Sig Dispense Refill  . atenolol (TENORMIN) 25 MG tablet Take 25 mg by mouth daily.    . benzonatate (TESSALON) 100 MG capsule Take 1-2 capsules (100-200 mg total) by mouth 3 (three) times daily as needed for cough. 40 capsule 0  . cetirizine (ZYRTEC) 10 MG tablet Take 10 mg by mouth daily.    . fluticasone (FLONASE) 50 MCG/ACT nasal spray Place 2 sprays into both nostrils daily. 16 g 12  .  Guaifenesin (MUCINEX MAXIMUM STRENGTH) 1200 MG TB12 Take 1 tablet (1,200 mg total) by mouth every 12 (twelve) hours as needed. 14 tablet 1  . hydrochlorothiazide (HYDRODIURIL) 25 MG tablet Take 25 mg by mouth daily.    Marland Kitchen. ipratropium (ATROVENT) 0.03 % nasal spray Place 2 sprays into both nostrils 3 (three) times daily. 30 mL 2  . metaxalone (SKELAXIN) 800 MG tablet Take 800 mg by mouth daily as needed for pain.    Marland Kitchen. omeprazole (PRILOSEC) 20 MG capsule Take 20 mg by mouth daily.    . traMADol (ULTRAM) 50 MG tablet Take 1 tablet (50 mg total) by mouth every 8 (eight) hours as needed. 30 tablet 0   No current facility-administered medications on file prior to visit.    No Known Allergies   Review of Systems  Constitutional: Negative for fever and chills.  Eyes: Negative for pain, discharge and redness.  Respiratory: Positive for cough.   Cardiovascular: Positive for chest pain.  Gastrointestinal: Negative for nausea and vomiting.  Musculoskeletal: Positive for back pain.       Objective:   Physical Exam  Constitutional: She appears well-developed and well-nourished. No distress.  HENT:  Head: Normocephalic and atraumatic.  Mid ear effusion bilaterally right with  erythema. Little purulent nasal rhinorrhea.  oropharynx normal.   Eyes: Right eye exhibits no discharge. Left eye exhibits no discharge.  Neck: Thyromegaly present.  Very large symmetrical thyroid enlargement without nodules or tenderness On proximal quarter of bilateral clavicles is a well defined mobile subdermal soft tissue mass/ ddx lipoma versus LAD versus thyroid tissue extravasation or other   Cardiovascular: Normal rate, regular rhythm, S1 normal, S2 normal and normal heart sounds.   No murmur heard. Pulmonary/Chest: Effort normal. No respiratory distress. She has rales.  Good air movement throughout Left lower lobe with faint end inspiratory rales that seem to resolve with continued deep breathing Frequent cough  is very deep and harsh Non productive throughout exam.    Skin: She is not diaphoretic.  Psychiatric: She has a normal mood and affect. Her behavior is normal.  Nursing note and vitals reviewed.   Filed Vitals:   09/19/14 0919  BP: 116/70  Pulse: 84  Temp: 98.4 F (36.9 C)  TempSrc: Oral  Resp: 18  Height:  (1.651 m)  Weight: 234 lb 12.8 oz (106.505 kg)  SpO2: 98%   [facility] reading (PRIMARY) by  Dr. Clelia Croft. CXR: bibasilar infiltrate Rt>Lt; several tiny nodules throughout distal lung tissues,  Enlarged cardiac sillohuete  Dg Chest 2 View  09/19/2014   CLINICAL DATA:  Cough  EXAM: CHEST  2 VIEW  COMPARISON:  May 03, 2010  FINDINGS: The heart size and mediastinal contours are within normal limits. Stable calcified granulomas are identified in both lungs. Both lungs are clear. The visualized skeletal structures are unremarkable.  IMPRESSION: No active cardiopulmonary disease.  No focal pneumonia bilaterally.   Electronically Signed   By: Sherian Rein M.D.   On: 09/19/2014 10:35    Results for orders placed or performed in visit on 09/19/14  POCT CBC  Result Value Ref Range   WBC 13.3 (A) 4.6 - 10.2 K/uL   Lymph, poc 3.0 0.6 - 3.4   POC LYMPH PERCENT 22.4 10 - 50 %L   MID (cbc) 0.9 0 - 0.9   POC MID % 6.8 0 - 12 %M   POC Granulocyte 9.4 (A) 2 - 6.9   Granulocyte percent 70.8 37 - 80 %G   RBC 5.36 4.04 - 5.48 M/uL   Hemoglobin 12.8 12.2 - 16.2 g/dL   HCT, POC 96.0 45.4 - 47.9 %   MCV 74.5 (A) 80 - 97 fL   MCH, POC 23.9 (A) 27 - 31.2 pg   MCHC 32.1 31.8 - 35.4 g/dL   RDW, POC 09.8 %   Platelet Count, POC 482 (A) 142 - 424 K/uL   MPV 7.1 0 - 99.8 fL     Assessment & Plan:   Patient advised that will begin to evaluate supraclavicular mass right greater than left with CBC and chest x ray, have rechecked in 2 weeks with further CT and ultrasound imaging and also TFTs if at all persistent.   1. Subcutaneous mass of supraclavicular area   2. Cough   3. CAP (community  acquired pneumonia)   4. Walking pneumonia     Orders Placed This Encounter  Procedures  . DG Chest 2 View    Standing Status: Future     Number of Occurrences: 1     Standing Expiration Date: 09/21/2014    Order Specific Question:  Reason for Exam (SYMPTOM  OR DIAGNOSIS REQUIRED)    Answer:  deep hacking cough, Rt>Lt supraclavicular mass, LLL insp rales  Order Specific Question:  Is the patient pregnant?    Answer:  No    Order Specific Question:  Preferred imaging location?    Answer:  External  . POCT CBC  . POCT SEDIMENTATION RATE    Meds ordered this encounter  Medications  . azithromycin (ZITHROMAX) 500 MG tablet    Sig: Take 1 tablet (500 mg total) by mouth daily.    Dispense:  3 tablet    Refill:  0  . Guaifenesin (MUCINEX MAXIMUM STRENGTH) 1200 MG TB12    Sig: Take 1 tablet (1,200 mg total) by mouth every 12 (twelve) hours as needed.    Dispense:  14 tablet    Refill:  1  . benzonatate (TESSALON) 100 MG capsule    Sig: Take 1-2 capsules (100-200 mg total) by mouth 3 (three) times daily as needed for cough.    Dispense:  40 capsule    Refill:  0    I personally performed the services described in this documentation, which was scribed in my presence. The recorded information has been reviewed and considered, and addended by me as needed.  Tara SorensonEva Terecia Plaut, MD MPH

## 2014-09-19 NOTE — Patient Instructions (Addendum)
F/u with your PCP in 1-2 weeks for evaluation of the supraclavicular mass.  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  Your chest xray was normal. Pneumonia Pneumonia is an infection of the lungs.  CAUSES Pneumonia may be caused by bacteria or a virus. Usually, these infections are caused by breathing infectious particles into the lungs (respiratory tract). SIGNS AND SYMPTOMS   Cough.  Fever.  Chest pain.  Increased rate of breathing.  Wheezing.  Mucus production. DIAGNOSIS  If you have the common symptoms of pneumonia, your health care provider will typically confirm the diagnosis with a chest X-ray. The X-ray will show an abnormality in the lung (pulmonary infiltrate) if you have pneumonia. Other tests of your blood, urine, or sputum may be done to find the specific cause of your pneumonia. Your health care provider may also do tests (blood gases or pulse oximetry) to see how well your lungs are working. TREATMENT  Some forms of pneumonia may be spread to other people when you cough or sneeze. You may be asked to wear a mask before and during your exam. Pneumonia that is caused by bacteria is treated with antibiotic medicine. Pneumonia that is caused by the influenza virus may be treated with an antiviral medicine. Most other viral infections must run their course. These infections will not respond to antibiotics.  HOME CARE INSTRUCTIONS   Cough suppressants may be used if you are losing too much rest. However, coughing protects you by clearing your lungs. You should avoid using cough suppressants if you can.  Your health care provider may have prescribed medicine if he or she thinks your pneumonia is caused by bacteria or influenza. Finish your medicine even if you start to feel better.  Your health care provider may also prescribe an expectorant. This loosens the mucus to be coughed up.  Take medicines only  as directed by your health care provider.  Do not smoke. Smoking is a common cause of bronchitis and can contribute to pneumonia. If you are a smoker and continue to smoke, your cough may last several weeks after your pneumonia has cleared.  A cold steam vaporizer or humidifier in your room or home may help loosen mucus.  Coughing is often worse at night. Sleeping in a semi-upright position in a recliner or using a couple pillows under your head will help with this.  Get rest as you feel it is needed. Your body will usually let you know when you need to rest. PREVENTION A pneumococcal shot (vaccine) is available to prevent a common bacterial cause of pneumonia. This is usually suggested for:  People over 55 years old.  Patients on chemotherapy.  People with chronic lung problems, such as bronchitis or emphysema.  People with immune system problems. If you are over 65 or have a high risk condition, you may receive the pneumococcal vaccine if you have not received it before. In some countries, a routine influenza vaccine is also recommended. This vaccine can help prevent some cases of pneumonia.You may be offered the influenza vaccine as part of your care. If you smoke, it is time to quit. You may receive instructions on how to stop smoking. Your health care provider can provide medicines and counseling to help you quit. SEEK MEDICAL CARE IF: You have a fever. SEEK IMMEDIATE MEDICAL CARE IF:   Your illness becomes worse. This is especially true if  you are elderly or weakened from any other disease.  You cannot control your cough with suppressants and are losing sleep.  You begin coughing up blood.  You develop pain which is getting worse or is uncontrolled with medicines.  Any of the symptoms which initially brought you in for treatment are getting worse rather than better.  You develop shortness of breath or chest pain. MAKE SURE YOU:   Understand these instructions.  Will  watch your condition.  Will get help right away if you are not doing well or get worse. Document Released: 04/23/2005 Document Revised: 09/07/2013 Document Reviewed: 07/13/2010 [facility] Patient Information 2015 [employer] [place]. This information is not intended to replace advice given to you by your health care provider. Make sure you discuss any questions you have with your health care provider.

## 2014-10-28 HISTORY — PX: BACK SURGERY: SHX140

## 2014-10-29 ENCOUNTER — Emergency Department (HOSPITAL_COMMUNITY)
Admission: EM | Admit: 2014-10-29 | Discharge: 2014-10-29 | Disposition: A | Discharge status: Home / Self Care | Payer: 59 | Attending: Emergency Medicine | Admitting: Emergency Medicine

## 2014-10-29 ENCOUNTER — Encounter (HOSPITAL_COMMUNITY): Payer: Self-pay | Admitting: Emergency Medicine

## 2014-10-29 ENCOUNTER — Emergency Department (HOSPITAL_COMMUNITY): Payer: 59

## 2014-10-29 DIAGNOSIS — Y9389 Activity, other specified: Secondary | ICD-10-CM | POA: Diagnosis not present

## 2014-10-29 DIAGNOSIS — Y929 Unspecified place or not applicable: Secondary | ICD-10-CM | POA: Diagnosis not present

## 2014-10-29 DIAGNOSIS — W1839XA Other fall on same level, initial encounter: Secondary | ICD-10-CM | POA: Insufficient documentation

## 2014-10-29 DIAGNOSIS — G43909 Migraine, unspecified, not intractable, without status migrainosus: Secondary | ICD-10-CM | POA: Insufficient documentation

## 2014-10-29 DIAGNOSIS — R55 Syncope and collapse: Secondary | ICD-10-CM

## 2014-10-29 DIAGNOSIS — Y998 Other external cause status: Secondary | ICD-10-CM | POA: Diagnosis not present

## 2014-10-29 DIAGNOSIS — S0990XA Unspecified injury of head, initial encounter: Secondary | ICD-10-CM | POA: Insufficient documentation

## 2014-10-29 DIAGNOSIS — E876 Hypokalemia: Secondary | ICD-10-CM | POA: Diagnosis not present

## 2014-10-29 DIAGNOSIS — M1990 Unspecified osteoarthritis, unspecified site: Secondary | ICD-10-CM | POA: Insufficient documentation

## 2014-10-29 DIAGNOSIS — I10 Essential (primary) hypertension: Secondary | ICD-10-CM | POA: Insufficient documentation

## 2014-10-29 LAB — CBC WITH DIFFERENTIAL/PLATELET
Basophils Absolute: 0 10*3/uL (ref 0.0–0.1)
Basophils Relative: 0 % (ref 0–1)
Eosinophils Absolute: 0 10*3/uL (ref 0.0–0.7)
Eosinophils Relative: 0 % (ref 0–5)
HCT: 34.1 % — ABNORMAL LOW (ref 36.0–46.0)
Hemoglobin: 11.1 g/dL — ABNORMAL LOW (ref 12.0–15.0)
Lymphocytes Relative: 12 % (ref 12–46)
Lymphs Abs: 1.6 10*3/uL (ref 0.7–4.0)
MCH: 24.9 pg — ABNORMAL LOW (ref 26.0–34.0)
MCHC: 32.6 g/dL (ref 30.0–36.0)
MCV: 76.6 fL — ABNORMAL LOW (ref 78.0–100.0)
Monocytes Absolute: 1.1 10*3/uL — ABNORMAL HIGH (ref 0.1–1.0)
Monocytes Relative: 8 % (ref 3–12)
Neutro Abs: 11.2 10*3/uL — ABNORMAL HIGH (ref 1.7–7.7)
Neutrophils Relative %: 80 % — ABNORMAL HIGH (ref 43–77)
Platelets: 381 10*3/uL (ref 150–400)
RBC: 4.45 MIL/uL (ref 3.87–5.11)
RDW: 15.6 % — ABNORMAL HIGH (ref 11.5–15.5)
WBC: 14.1 10*3/uL — ABNORMAL HIGH (ref 4.0–10.5)

## 2014-10-29 LAB — BASIC METABOLIC PANEL
Anion gap: 9 (ref 5–15)
BUN: <5 mg/dL — ABNORMAL LOW (ref 6–20)
CO2: 30 mmol/L (ref 22–32)
Calcium: 8.7 mg/dL — ABNORMAL LOW (ref 8.9–10.3)
Chloride: 99 mmol/L — ABNORMAL LOW (ref 101–111)
Creatinine, Ser: 0.7 mg/dL (ref 0.44–1.00)
GFR calc Af Amer: >60 mL/min (ref >60)
GFR calc non Af Amer: >60 mL/min (ref >60)
Glucose, Bld: 128 mg/dL — ABNORMAL HIGH (ref 65–99)
Potassium: 2.8 mmol/L — ABNORMAL LOW (ref 3.5–5.1)
Sodium: 138 mmol/L (ref 135–145)

## 2014-10-29 MED ORDER — POTASSIUM CHLORIDE CRYS ER 20 MEQ PO TBCR
60.0000 meq | EXTENDED_RELEASE_TABLET | Freq: Once | ORAL | Status: AC
  Administered 2014-10-29: 60 meq via ORAL
  Filled 2014-10-29: qty 3

## 2014-10-29 MED ORDER — POTASSIUM CHLORIDE 10 MEQ/100ML IV SOLN
10.0000 meq | Freq: Once | INTRAVENOUS | Status: AC
  Administered 2014-10-29: 10 meq via INTRAVENOUS
  Filled 2014-10-29: qty 100

## 2014-10-29 MED ORDER — SODIUM CHLORIDE 0.9 % IV BOLUS (SEPSIS)
1000.0000 mL | Freq: Once | INTRAVENOUS | Status: AC
  Administered 2014-10-29: 1000 mL via INTRAVENOUS

## 2014-10-29 NOTE — Discharge Instructions (Signed)
Syncope °Syncope is a medical term for fainting or passing out. This means you lose consciousness and drop to the ground. People are generally unconscious for less than 5 minutes. You may have some muscle twitches for up to 15 seconds before waking up and returning to normal. Syncope occurs more often in older adults, but it can happen to anyone. While most causes of syncope are not dangerous, syncope can be a sign of a serious medical problem. It is important to seek medical care.  °CAUSES  °Syncope is caused by a sudden drop in blood flow to the brain. The specific cause is often not determined. Factors that can bring on syncope include: °· Taking medicines that lower blood pressure. °· Sudden changes in posture, such as standing up quickly. °· Taking more medicine than prescribed. °· Standing in one place for too long. °· Seizure disorders. °· Dehydration and excessive exposure to heat. °· Low blood sugar (hypoglycemia). °· Straining to have a bowel movement. °· Heart disease, irregular heartbeat, or other circulatory problems. °· Fear, emotional distress, seeing blood, or severe pain. °SYMPTOMS  °Right before fainting, you may: °· Feel dizzy or light-headed. °· Feel nauseous. °· See all white or all black in your field of vision. °· Have cold, clammy skin. °DIAGNOSIS  °Your health care provider will ask about your symptoms, perform a physical exam, and perform an electrocardiogram (ECG) to record the electrical activity of your heart. Your health care provider may also perform other heart or blood tests to determine the cause of your syncope which may include: °· Transthoracic echocardiogram (TTE). During echocardiography, sound waves are used to evaluate how blood flows through your heart. °· Transesophageal echocardiogram (TEE). °· Cardiac monitoring. This allows your health care provider to monitor your heart rate and rhythm in real time. °· Holter monitor. This is a portable device that records your  heartbeat and can help diagnose heart arrhythmias. It allows your health care provider to track your heart activity for several days, if needed. °· Stress tests by exercise or by giving medicine that makes the heart beat faster. °TREATMENT  °In most cases, no treatment is needed. Depending on the cause of your syncope, your health care provider may recommend changing or stopping some of your medicines. °HOME CARE INSTRUCTIONS °· Have someone stay with you until you feel stable. °· Do not drive, use machinery, or play sports until your health care provider says it is okay. °· Keep all follow-up appointments as directed by your health care provider. °· Lie down right away if you start feeling like you might faint. Breathe deeply and steadily. Wait until all the symptoms have passed. °· Drink enough fluids to keep your urine clear or pale yellow. °· If you are taking blood pressure or heart medicine, get up slowly and take several minutes to sit and then stand. This can reduce dizziness. °SEEK IMMEDIATE MEDICAL CARE IF:  °· You have a severe headache. °· You have unusual pain in the chest, abdomen, or back. °· You are bleeding from your mouth or rectum, or you have black or tarry stool. °· You have an irregular or very fast heartbeat. °· You have pain with breathing. °· You have repeated fainting or seizure-like jerking during an episode. °· You faint when sitting or lying down. °· You have confusion. °· You have trouble walking. °· You have severe weakness. °· You have vision problems. °If you fainted, call your local emergency services (911 in [place] Do not drive   yourself to the hospital.  °MAKE SURE YOU: °· Understand these instructions. °· Will watch your condition. °· Will get help right away if you are not doing well or get worse. °Document Released: 04/23/2005 Document Revised: 04/28/2013 Document Reviewed: 06/22/2011 °Exit[employer]® Patient Information ©2015 [employer]. This information is not intended to replace  advice given to you by your health care provider. Make sure you discuss any questions you have with your health care provider. ° °

## 2014-10-29 NOTE — ED Provider Notes (Signed)
CSN: [id number]     Arrival date & time 10/29/14  7829 History   First MD Initiated Contact with Patient 10/29/14 [phone]     Chief Complaint  Patient presents with  . Loss of Consciousness  . Head Injury     (Consider location/radiation/quality/duration/timing/severity/associated sxs/prior Treatment) HPI  54 year old female with syncope. Happened around 7:30 this morning. Patient is had lower back surgery. She denies any worsening back pain. No acute numbness or tingling anywhere. She was standing upright when symptoms occurred. She began to feel nauseated and lightheaded. She is able to sit herself down but then fell from the seated position. Brief LOC. Denies significant headache or neck pain.  Past Medical History  Diagnosis Date  . Hypertension   . Migraine   . Allergic rhinitis   . Arthritis   . Thyroid disease   . Allergy    Past Surgical History  Procedure Laterality Date  . Tonsillectomy and adenoidectomy    . Back surgery  10/28/2014   Family History  Problem Relation Age of Onset  . Cancer Mother 59    lymphoma  . COPD Father   . Thyroid disease Sister   . Hypertension Brother   . Sleep apnea Brother    History  Substance Use Topics  . Smoking status: Never Smoker   . Smokeless tobacco: Never Used  . Alcohol Use: No   OB History    No data available     Review of Systems  All systems reviewed and negative, other than as noted in HPI.   Allergies  Review of patient's allergies indicates no known allergies.  Home Medications   Prior to Admission medications   Medication Sig Start Date End Date Taking? Authorizing Provider  atenolol (TENORMIN) 25 MG tablet Take 25 mg by mouth daily.   Yes Historical Provider, MD  cetirizine (ZYRTEC) 10 MG tablet Take 10 mg by mouth daily.   Yes Historical Provider, MD  docusate sodium (COLACE) 100 MG capsule Take 100 mg by mouth 2 (two) times daily as needed for mild constipation.   Yes Historical Provider, MD   hydrochlorothiazide (HYDRODIURIL) 25 MG tablet Take 25 mg by mouth daily.   Yes Historical Provider, MD  HYDROcodone-acetaminophen (NORCO/VICODIN) 5-325 MG per tablet Take 1-2 tablets by mouth every 6 (six) hours as needed for moderate pain.   Yes Historical Provider, MD  omeprazole (PRILOSEC) 20 MG capsule Take 20 mg by mouth daily.   Yes Historical Provider, MD  azithromycin (ZITHROMAX) 500 MG tablet Take 1 tablet (500 mg total) by mouth daily. Patient not taking: Reported on 10/29/2014 09/19/14   Sherren Mocha, MD  benzonatate (TESSALON) 100 MG capsule Take 1-2 capsules (100-200 mg total) by mouth 3 (three) times daily as needed for cough. Patient not taking: Reported on 10/29/2014 09/19/14   Sherren Mocha, MD  fluticasone [facility]) 50 MCG/ACT nasal spray Place 2 sprays into both nostrils daily. Patient not taking: Reported on 10/29/2014 08/28/13   Chelle Jeffery, PA-C  Guaifenesin (MUCINEX MAXIMUM STRENGTH) 1200 MG TB12 Take 1 tablet (1,200 mg total) by mouth every 12 (twelve) hours as needed. Patient not taking: Reported on 10/29/2014 09/19/14   Sherren Mocha, MD  ipratropium (ATROVENT) 0.03 % nasal spray Place 2 sprays into both nostrils 3 (three) times daily. Patient not taking: Reported on 10/29/2014 09/11/14   Pearline Cables, MD  traMADol (ULTRAM) 50 MG tablet Take 1 tablet (50 mg total) by mouth every 8 (eight) hours as needed. Patient  not taking: Reported on 10/29/2014 09/05/13   [name], MD   BP 110/94 mmHg  Pulse 78  Temp(Src) 97.5 F (36.4 C) (Oral)  Resp 17  Ht  (1.651 m)  Wt 230 lb (104.327 kg)  BMI 38.27 kg/m2  SpO2 99%  LMP 10/24/2014 (Exact Date) Physical Exam  Constitutional: She is oriented to person, place, and time. She appears well-developed and well-nourished. No distress.  HENT:  Head: Normocephalic and atraumatic.  Eyes: Conjunctivae are normal. Right eye exhibits no discharge. Left eye exhibits no discharge.  Neck: Neck supple.  Cardiovascular: Normal rate,  regular rhythm and normal heart sounds.  Exam reveals no gallop and no friction rub.   No murmur heard. Pulmonary/Chest: Effort normal and breath sounds normal. No respiratory distress.  Abdominal: Soft. She exhibits no distension. There is no tenderness.  Musculoskeletal: She exhibits no edema or tenderness.  Surgical site with dry/clean bandage. No concerning skin changes noted.  Neurological: She is alert and oriented to person, place, and time. No cranial nerve deficit. She exhibits normal muscle tone. Coordination normal.  Skin: Skin is warm and dry.  Psychiatric: She has a normal mood and affect. Her behavior is normal. Thought content normal.  Nursing note and vitals reviewed.   ED Course  Procedures (including critical care time) Labs Review Labs Reviewed  CBC WITH DIFFERENTIAL/PLATELET - Abnormal; Notable for the following:    WBC 14.1 (*)    Hemoglobin 11.1 (*)    HCT 34.1 (*)    MCV 76.6 (*)    MCH 24.9 (*)    RDW 15.6 (*)    Neutrophils Relative % 80 (*)    Neutro Abs 11.2 (*)    Monocytes Absolute 1.1 (*)    All other components within normal limits  BASIC METABOLIC PANEL - Abnormal; Notable for the following:    Potassium 2.8 (*)    Chloride 99 (*)    Glucose, Bld 128 (*)    BUN <5 (*)    Calcium 8.7 (*)    All other components within normal limits    Imaging Review Ct Head Wo Contrast  10/29/2014   CLINICAL DATA:  Syncopal episode today.  EXAM: CT HEAD WITHOUT CONTRAST  TECHNIQUE: Contiguous axial images were obtained from the base of the skull through the vertex without contrast.  COMPARISON:  None  FINDINGS: No evidence for acute hemorrhage, mass lesion, midline shift, hydrocephalus or large infarct. Evidence for soft tissue swelling along the left forehead. Paranasal sinuses are clear. No acute bone abnormality.  IMPRESSION: No acute intracranial abnormality.  Soft tissue swelling along the left forehead.   Electronically Signed   By: Richarda Overlie M.D.   On:  10/29/2014 11:17     EKG Interpretation None      MDM   Final diagnoses:  Syncope and collapse  Hypokalemia    53yF with syncope. No further complaints. S/p back surgery yesterday. Surgical site looks good. No acute neuro complaints. Afebrile. HD stable. EKG w/o concerning findings. Hypokalemia. Supplementation given. It has been determined that no acute conditions requiring further emergency intervention are present at this time. The patient has been advised of the diagnosis and plan. I reviewed any labs and imaging including any potential incidental findings. We have discussed signs and symptoms that warrant return to the ED and they are listed in the discharge instructions.      Raeford Razor, MD 11/03/14 [phone]

## 2014-10-29 NOTE — ED Notes (Signed)
Pt reports woke up around 0630 and had a syncopal episode around 0730 today. Pt reports prior to passing out she felt nausea, sat down on a stool then passed out. Family was at the house but did not see pt until they heard her hit the floor. Pt AAOx3.

## 2014-11-26 ENCOUNTER — Ambulatory Visit (INDEPENDENT_AMBULATORY_CARE_PROVIDER_SITE_OTHER): Payer: 59 | Admitting: Family Medicine

## 2014-11-26 VITALS — BP 110/74 | HR 86 | Temp 98.1°F | Resp 14 | Ht 65.75 in | Wt 229.2 lb

## 2014-11-26 DIAGNOSIS — R519 Headache, unspecified: Secondary | ICD-10-CM

## 2014-11-26 DIAGNOSIS — E876 Hypokalemia: Secondary | ICD-10-CM

## 2014-11-26 DIAGNOSIS — R51 Headache: Secondary | ICD-10-CM

## 2014-11-26 DIAGNOSIS — I10 Essential (primary) hypertension: Secondary | ICD-10-CM

## 2014-11-26 LAB — COMPLETE METABOLIC PANEL WITH GFR
ALT: 16 U/L (ref 0–35)
AST: 16 U/L (ref 0–37)
Albumin: 4.1 g/dL (ref 3.5–5.2)
Alkaline Phosphatase: 68 U/L (ref 39–117)
BUN: 12 mg/dL (ref 6–23)
CO2: 31 mEq/L (ref 19–32)
Calcium: 9.6 mg/dL (ref 8.4–10.5)
Chloride: 98 mEq/L (ref 96–112)
Creat: 0.66 mg/dL (ref 0.50–1.10)
GFR, Est African American: >89 mL/min
GFR, Est Non African American: >89 mL/min
Glucose, Bld: 105 mg/dL — ABNORMAL HIGH (ref 70–99)
Potassium: 3.7 mEq/L (ref 3.5–5.3)
Sodium: 141 mEq/L (ref 135–145)
Total Bilirubin: 0.6 mg/dL (ref 0.2–1.2)
Total Protein: 7.1 g/dL (ref 6.0–8.3)

## 2014-11-26 MED ORDER — TRIAMTERENE-HCTZ 37.5-25 MG PO TABS: 1.0000 | ORAL_TABLET | Freq: Every day | ORAL | Status: AC

## 2014-11-26 NOTE — Progress Notes (Signed)
This chart was scribed for Tara Sidle, MD by Stann Ore, medical scribe at [facility].The patient was seen in exam room 2 and the patient's care was started at 9:56 AM.  Patient ID: Tara Hill MRN: [id number], DOB: 1961/03/08, 54 y.o. Date of Encounter: 11/26/2014  Primary Physician: Allean Found, MD  Chief Complaint:  Chief Complaint  Patient presents with   Follow-up    Syncope, Headache, Low Potassium   Dizziness   Nausea    HPI:  Tara Hill is a 54 y.o. female who presents to [facility] for follow up from ED visit 4 weeks ago.  Her potassium was low and ended up in ED. She was given pills and IV.  She had back surgery (done by Dr. Yetta Barre) a month ago (June 23rd). She was given stool softener and usually has complications with them. It caused her to feel nauseous, syncope and fall the next day on June 24th. Before she can brace herself for impact, she hit her left eye causing it to bruise and swell. She last saw Dr. Yetta Barre a week ago. She has not taken any medication for her eye. She denies any vision disturbances.   She has thyroid issues and sees endocrinologist.   She is still taking the HCTZ for her blood pressure.  Her PCP is [facility].   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 She is currently out of work until July 27th.   Past Medical History  Diagnosis Date   Hypertension    Migraine    Allergic rhinitis    Arthritis    Thyroid disease    Allergy      Home Meds: Prior to Admission medications   Medication Sig Start Date End Date Taking? Authorizing Provider  atenolol (TENORMIN) 25 MG tablet Take 25 mg by mouth daily.   Yes Historical Provider, MD  cetirizine (ZYRTEC) 10 MG tablet Take 10 mg by mouth daily.   Yes Historical Provider, MD  hydrochlorothiazide (HYDRODIURIL) 25 MG tablet Take 25 mg by mouth daily.   Yes Historical Provider, MD  omeprazole (PRILOSEC) 20 MG capsule Take  20 mg by mouth daily.   Yes Historical Provider, MD  azithromycin (ZITHROMAX) 500 MG tablet Take 1 tablet (500 mg total) by mouth daily. Patient not taking: Reported on 10/29/2014 09/19/14   Sherren Mocha, MD  benzonatate (TESSALON) 100 MG capsule Take 1-2 capsules (100-200 mg total) by mouth 3 (three) times daily as needed for cough. Patient not taking: Reported on 10/29/2014 09/19/14   Sherren Mocha, MD  docusate sodium (COLACE) 100 MG capsule Take 100 mg by mouth 2 (two) times daily as needed for mild constipation.    Historical Provider, MD  fluticasone (FLONASE) 50 MCG/ACT nasal spray Place 2 sprays into both nostrils daily. Patient not taking: Reported on 10/29/2014 08/28/13   Chelle Jeffery, PA-C  Guaifenesin (MUCINEX MAXIMUM STRENGTH) 1200 MG TB12 Take 1 tablet (1,200 mg total) by mouth every 12 (twelve) hours as needed. Patient not taking: Reported on 10/29/2014 09/19/14   Sherren Mocha, MD  HYDROcodone-acetaminophen (NORCO/VICODIN) 5-325 MG per tablet Take 1-2 tablets by mouth every 6 (six) hours as needed for moderate pain.    Historical Provider, MD  ipratropium (ATROVENT) 0.03 % nasal spray Place 2 sprays into both nostrils 3 (three) times daily. Patient not taking: Reported on 10/29/2014 09/11/14   Pearline Cables, MD  traMADol (ULTRAM) 50 MG tablet Take  1 tablet (50 mg total) by mouth every 8 (eight) hours as needed. 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 No family locally. Sister lives in  [place], [place].     Review of Systems: Constitutional: negative for chills, fever, night sweats, weight changes, or fatigue  HEENT: negative for vision changes, hearing loss, congestion, rhinorrhea, ST, epistaxis, or sinus pressure Cardiovascular: negative for chest pain or palpitations Respiratory: negative for hemoptysis, wheezing, shortness of breath, or cough Abdominal: negative for abdominal pain, vomiting, diarrhea, or constipation; positive for nausea Dermatological: negative for rash Neurologic: negative for headache, or syncope; positive for dizziness All other systems reviewed and are otherwise negative with the exception to those above and in the HPI.  Physical Exam: Blood pressure 110/74, pulse 86, temperature 98.1 F (36.7 C), temperature source Oral, resp. rate 14, height 5' 5.75" (1.67 m), weight 229 lb 3.2 oz (103.964 kg), last menstrual period 11/10/2014, SpO2 97 %., Body mass index is 37.28 kg/(m^2). General: Well developed, well nourished, in no acute distress. Head: Normocephalic, atraumatic, eyes without discharge, sclera non-icteric, nares are without discharge. Bilateral auditory canals clear, TM's are without perforation, pearly grey and translucent with reflective cone of light bilaterally. Oral cavity moist, posterior pharynx without exudate, erythema, peritonsillar abscess, or post nasal drip. Normal left fundus and normal extraocular movement.  Neck: Supple. Diffusely enlarged thyroid bilaterally which is nontender Full ROM. No lymphadenopathy. Lungs: Clear bilaterally to auscultation without wheezes, rales, or rhonchi. Breathing is unlabored. Heart: RRR with S1 S2. No murmurs, rubs, or gallops appreciated. Abdomen: Soft, non-tender, non-distended with normoactive bowel sounds. No hepatomegaly. No rebound/guarding. No obvious abdominal masses. Msk:  Strength and tone normal for age. Extremities/Skin: Warm and dry. No clubbing or cyanosis. No edema. No rashes or  suspicious lesions. Fading ecchymosis below left eye. Neuro: Alert and oriented X 3. Moves all extremities spontaneously. Gait is normal. CNII-XII grossly in tact. Patient is tender in the supraorbital notch with mild swelling under the eyebrow on the left side. Psych:  Responds to questions appropriately with a normal affect.   Labs: Results for orders placed or performed during the hospital encounter of 10/29/14  CBC with Differential  Result Value Ref Range   WBC 14.1 (H) 4.0 - 10.5 K/uL   RBC 4.45 3.87 - 5.11 MIL/uL   Hemoglobin 11.1 (L) 12.0 - 15.0 g/dL   HCT 98.1 (L) 19.1 - 47.8 %   MCV 76.6 (L) 78.0 - 100.0 fL   MCH 24.9 (L) 26.0 - 34.0 pg   MCHC 32.6 30.0 - 36.0 g/dL   RDW 29.5 (H) 62.1 - 30.8 %   Platelets 381 150 - 400 K/uL   Neutrophils Relative % 80 (H) 43 - 77 %   Neutro Abs 11.2 (H) 1.7 - 7.7 K/uL   Lymphocytes Relative 12 12 - 46 %   Lymphs Abs 1.6 0.7 -  4.0 K/uL   Monocytes Relative 8 3 - 12 %   Monocytes Absolute 1.1 (H) 0.1 - 1.0 K/uL   Eosinophils Relative 0 0 - 5 %   Eosinophils Absolute 0.0 0.0 - 0.7 K/uL   Basophils Relative 0 0 - 1 %   Basophils Absolute 0.0 0.0 - 0.1 K/uL  Basic metabolic panel  Result Value Ref Range   Sodium 138 135 - 145 mmol/L   Potassium 2.8 (L) 3.5 - 5.1 mmol/L   Chloride 99 (L) 101 - 111 mmol/L   CO2 30 22 - 32 mmol/L   Glucose, Bld 128 (H) 65 - 99 mg/dL   BUN <5 (L) 6 - 20 mg/dL   Creatinine, Ser 1.61 0.44 - 1.00 mg/dL   Calcium 8.7 (L) 8.9 - 10.3 mg/dL   GFR calc non Af Amer >60 >60 mL/min   GFR calc Af Amer >60 >60 mL/min   Anion gap 9 5 - 15   I explained that the low potassium and is mostly coming from her hydrochlorothiazide use.  ASSESSMENT AND PLAN:  54 y.o. year old female with  This chart was scribed in my presence and reviewed by me personally.    ICD-9-CM ICD-10-CM   1. Left facial pain 784.0 R51   2. Essential hypertension 401.9 I10 triamterene-hydrochlorothiazide (MAXZIDE-25) 37.5-25 MG per tablet  3.  Hypokalemia 276.8 E87.6 triamterene-hydrochlorothiazide (MAXZIDE-25) 37.5-25 MG per tablet     COMPLETE METABOLIC PANEL WITH GFR   Patient has Naprosyn will be taking this twice a day. If her pain persists by next Wednesday, she should return for follow-up.  Signed, Tara Sidle, MD 11/26/2014 9:56 AM

## 2014-11-26 NOTE — Patient Instructions (Addendum)
The hydrochlorothiazide tends to lower the potassium as it signals the kidney to lose it. The eye pain is secondary to a bruised supraorbital nerve.  I think the Naprosyn when taken twice daily for a week will stop the pain. We'll call you back in 24 hours or so to let you know about the potassium.  I changed the blood pressure medicine so the low potassium is less likely to occur in the future.    Hypokalemia Hypokalemia means that the amount of potassium in the blood is lower than normal.Potassium is a chemical, called an electrolyte, that helps regulate the amount of fluid in the body. It also stimulates muscle contraction and helps nerves function properly.Most of the body's potassium is inside of cells, and only a very small amount is in the blood. Because the amount in the blood is so small, minor changes can be life-threatening. CAUSES  Antibiotics.  Diarrhea or vomiting.  Using laxatives too much, which can cause diarrhea.  Chronic kidney disease.  Water pills (diuretics).  Eating disorders (bulimia).  Low magnesium level.  Sweating a lot. SIGNS AND SYMPTOMS  Weakness.  Constipation.  Fatigue.  Muscle cramps.  Mental confusion.  Skipped heartbeats or irregular heartbeat (palpitations).  Tingling or numbness. DIAGNOSIS  Your health care provider can diagnose hypokalemia with blood tests. In addition to checking your potassium level, your health care provider may also check other lab tests. TREATMENT Hypokalemia can be treated with potassium supplements taken by mouth or adjustments in your current medicines. If your potassium level is very low, you may need to get potassium through a vein (IV) and be monitored in the hospital. A diet high in potassium is also helpful. Foods high in potassium are:  Nuts, such as peanuts and pistachios.  Seeds, such as sunflower seeds and pumpkin seeds.  Peas, lentils, and lima beans.  Whole grain and bran cereals and  breads.  Fresh fruit and vegetables, such as apricots, avocado, bananas, cantaloupe, kiwi, oranges, tomatoes, asparagus, and potatoes.  Orange and tomato juices.  Red meats.  Fruit yogurt. HOME CARE INSTRUCTIONS  Take all medicines as prescribed by your health care provider.  Maintain a healthy diet by including nutritious food, such as fruits, vegetables, nuts, whole grains, and lean meats.  If you are taking a laxative, be sure to follow the directions on the label. SEEK MEDICAL CARE IF:  Your weakness gets worse.  You feel your heart pounding or racing.  You are vomiting or having diarrhea.  You are diabetic and having trouble keeping your blood glucose in the normal range. SEEK IMMEDIATE MEDICAL CARE IF:  You have chest pain, shortness of breath, or dizziness.  You are vomiting or having diarrhea for more than 2 days.  You faint. MAKE SURE YOU:   Understand these instructions.  Will watch your condition.  Will get help right away if you are not doing well or get worse. Document Released: 04/23/2005 Document Revised: 02/11/2013 Document Reviewed: 10/24/2012 [facility] Patient Information 2015 [place], [place]. This information is not intended to replace advice given to you by your health care provider. Make sure you discuss any questions you have with your health care provider.

## 2014-11-29 ENCOUNTER — Other Ambulatory Visit: Payer: Self-pay | Admitting: Internal Medicine

## 2014-11-29 DIAGNOSIS — E042 Nontoxic multinodular goiter: Secondary | ICD-10-CM

## 2014-12-06 ENCOUNTER — Ambulatory Visit
Admission: RE | Admit: 2014-12-06 | Discharge: 2014-12-06 | Disposition: A | Discharge status: Home / Self Care | Payer: 59 | Source: Ambulatory Visit | Attending: Internal Medicine | Admitting: Internal Medicine

## 2014-12-06 DIAGNOSIS — E042 Nontoxic multinodular goiter: Secondary | ICD-10-CM

## 2015-11-30 ENCOUNTER — Other Ambulatory Visit: Payer: Self-pay | Admitting: Internal Medicine

## 2015-11-30 DIAGNOSIS — E042 Nontoxic multinodular goiter: Secondary | ICD-10-CM

## 2015-12-13 ENCOUNTER — Ambulatory Visit
Admission: RE | Admit: 2015-12-13 | Discharge: 2015-12-13 | Disposition: A | Discharge status: Home / Self Care | Payer: 59 | Source: Ambulatory Visit | Attending: Internal Medicine | Admitting: Internal Medicine

## 2015-12-13 DIAGNOSIS — E042 Nontoxic multinodular goiter: Secondary | ICD-10-CM

## 2016-01-30 ENCOUNTER — Ambulatory Visit: Payer: Self-pay | Admitting: Surgery

## 2016-03-12 NOTE — Patient Instructions (Signed)
Randa SpikeJane A Pfiester  03/12/2016   Your procedure is scheduled on: 03/15/2016    [facility]  Entrance take [place]  elevators to 3rd floor to  Short Stay Center at   0830 AM.  Call this number if you have problems the morning of surgery [phone]   Remember: ONLY 1 PERSON MAY GO WITH YOU TO SHORT STAY TO GET  READY MORNING OF YOUR SURGERY.  Do not eat food or drink liquids :After Midnight.     Take these medicines the morning of surgery with A SIP OF WATER: Atenolol ( Tenormin), zyrtec, Prilosec                                 You may not have any metal on your body including hair pins and              piercings  Do not wear jewelry, make-up, lotions, powders or perfumes, deodorant             Do not wear nail polish.  Do not shave  48 hours prior to surgery.                Do not bring valuables to the hospital. [facility] IS NOT             RESPONSIBLE   FOR VALUABLES.  Contacts, dentures or bridgework may not be worn into surgery.  Leave suitcase in the car. After surgery it may be brought to your room.        Special Instructions: coughing and deep breathing exercises, leg exercises               Please read over the following fact sheets you were given: _____________________________________________________________________             [facility] - Preparing for Surgery Before surgery, you can play an important role.  Because skin is not sterile, your skin needs to be as free of germs as possible.  You can reduce the number of germs on your skin by washing with CHG (chlorahexidine gluconate) soap before surgery.  CHG is an antiseptic cleaner which kills germs and bonds with the skin to continue killing germs even after washing. Please DO NOT use if you have an allergy to CHG or antibacterial soaps.  If your skin becomes reddened/irritated stop using the CHG and inform your nurse when you arrive at Short Stay. Do not shave (including legs  and underarms) for at least 48 hours prior to the first CHG shower.  You may shave your face/neck. Please follow these instructions carefully:  1.  Shower with CHG Soap the night before surgery and the  morning of Surgery.  2.  If you choose to wash your hair, wash your hair first as usual with your  normal  shampoo.  3.  After you shampoo, rinse your hair and body thoroughly to remove the  shampoo.                           4.  Use CHG as you would any other liquid soap.  You can apply chg directly  to the skin and wash  Gently with a scrungie or clean washcloth.  5.  Apply the CHG Soap to your body ONLY FROM THE NECK DOWN.   Do not use on face/ open                           Wound or open sores. Avoid contact with eyes, ears mouth and genitals (private parts).                       Wash face,  Genitals (private parts) with your normal soap.             6.  Wash thoroughly, paying special attention to the area where your surgery  will be performed.  7.  Thoroughly rinse your body with warm water from the neck down.  8.  DO NOT shower/wash with your normal soap after using and rinsing off  the CHG Soap.                9.  Pat yourself dry with a clean towel.            10.  Wear clean pajamas.            11.  Place clean sheets on your bed the night of your first shower and do not  sleep with pets. Day of Surgery : Do not apply any lotions/deodorants the morning of surgery.  Please wear clean clothes to the hospital/surgery center.  FAILURE TO FOLLOW THESE INSTRUCTIONS MAY RESULT IN THE CANCELLATION OF YOUR SURGERY PATIENT SIGNATURE_________________________________  NURSE SIGNATURE__________________________________  ________________________________________________________________________

## 2016-03-13 ENCOUNTER — Encounter (HOSPITAL_COMMUNITY)
Admission: RE | Admit: 2016-03-13 | Discharge: 2016-03-13 | Disposition: A | Discharge status: Home / Self Care | Payer: 59 | Source: Ambulatory Visit | Attending: Surgery | Admitting: Surgery

## 2016-03-13 ENCOUNTER — Ambulatory Visit (HOSPITAL_COMMUNITY)
Admission: RE | Admit: 2016-03-13 | Discharge: 2016-03-13 | Disposition: A | Discharge status: Home / Self Care | Payer: 59 | Source: Ambulatory Visit | Attending: Anesthesiology | Admitting: Anesthesiology

## 2016-03-13 ENCOUNTER — Encounter (HOSPITAL_COMMUNITY): Payer: Self-pay

## 2016-03-13 ENCOUNTER — Encounter (HOSPITAL_COMMUNITY): Payer: Self-pay | Admitting: Surgery

## 2016-03-13 DIAGNOSIS — Z01810 Encounter for preprocedural cardiovascular examination: Secondary | ICD-10-CM | POA: Insufficient documentation

## 2016-03-13 DIAGNOSIS — Z01818 Encounter for other preprocedural examination: Secondary | ICD-10-CM | POA: Insufficient documentation

## 2016-03-13 DIAGNOSIS — Z01812 Encounter for preprocedural laboratory examination: Secondary | ICD-10-CM | POA: Diagnosis present

## 2016-03-13 DIAGNOSIS — I517 Cardiomegaly: Secondary | ICD-10-CM | POA: Diagnosis not present

## 2016-03-13 DIAGNOSIS — R9431 Abnormal electrocardiogram [ECG] [EKG]: Secondary | ICD-10-CM | POA: Insufficient documentation

## 2016-03-13 DIAGNOSIS — E049 Nontoxic goiter, unspecified: Secondary | ICD-10-CM | POA: Diagnosis present

## 2016-03-13 HISTORY — DX: Dyspnea, unspecified: R06.00

## 2016-03-13 HISTORY — DX: Gastro-esophageal reflux disease without esophagitis: K21.9

## 2016-03-13 HISTORY — DX: Cardiac murmur, unspecified: R01.1

## 2016-03-13 HISTORY — DX: Prediabetes: R73.03

## 2016-03-13 LAB — BASIC METABOLIC PANEL
Anion gap: 8 (ref 5–15)
BUN: 14 mg/dL (ref 6–20)
CALCIUM: 9.2 mg/dL (ref 8.9–10.3)
CO2: 29 mmol/L (ref 22–32)
CREATININE: 0.84 mg/dL (ref 0.44–1.00)
Chloride: 102 mmol/L (ref 101–111)
GFR calc Af Amer: >60 mL/min (ref >60)
GLUCOSE: 120 mg/dL — AB (ref 65–99)
Potassium: 2.9 mmol/L — ABNORMAL LOW (ref 3.5–5.1)
SODIUM: 139 mmol/L (ref 135–145)

## 2016-03-13 LAB — HCG, SERUM, QUALITATIVE: Preg, Serum: NEGATIVE

## 2016-03-13 LAB — CBC
HEMATOCRIT: 36.2 % (ref 36.0–46.0)
Hemoglobin: 11.9 g/dL — ABNORMAL LOW (ref 12.0–15.0)
MCH: 25.3 pg — ABNORMAL LOW (ref 26.0–34.0)
MCHC: 32.9 g/dL (ref 30.0–36.0)
MCV: 77 fL — ABNORMAL LOW (ref 78.0–100.0)
PLATELETS: 358 10*3/uL (ref 150–400)
RBC: 4.7 MIL/uL (ref 3.87–5.11)
RDW: 14.5 % (ref 11.5–15.5)
WBC: 6.6 10*3/uL (ref 4.0–10.5)

## 2016-03-13 NOTE — Progress Notes (Signed)
Called office and spoke with Toniann FailWendy ( Triage0 and she stated Dr Gerrit FriendsGerkin aware of 2.9 potassium from today's lab results.  Dr Gerrit FriendsGerkin per Toniann FailWendy called in potassium supplement for patient and for potassium level to be rechecked am of surgery .  I placed order in epic for potassium level to be drawn am of surgery.

## 2016-03-13 NOTE — Progress Notes (Signed)
Spoke with Marcelino DusterMichelle ( Triage) at [facility] and made her aware potassium 2.9 on BMP done 03/13/2016.  Made aware that I have routed via EPIC to Dr Gerrit FriendsGerkin.  Marcelino DusterMichelle stated Dr Gerrit FriendsGerkin in office today and she would get him a message.

## 2016-03-13 NOTE — Progress Notes (Signed)
Potassium- 2.9- BMP done 03/13/2016 faxed via EPIC to Dr Gerrit FriendsGerkin.

## 2016-03-13 NOTE — Progress Notes (Signed)
2V CXR done 03/13/16 faxed via EPIC to Dr Gerrit FriendsGerkin.

## 2016-03-13 NOTE — H&P (Signed)
General Surgery [facility]  Tara Hill DOB: 03-22-1961 Single / Language: [place] / Race: White Female  History of Present Illness  The patient is a 55 year old female who presents with a complaint of Enlarged thyroid.  Patient referred by Dr. Talmage CoinJeffrey Kerr for surgical evaluation of multinodular thyroid goiter. Patient states that she has had a large goiter for over 10 years. She has mild compressive symptoms with dysphagia of both solids and liquids and has recently developed some bilateral anterior and supraclavicular neck discomfort. Patient has never been on thyroid medication. She has had no prior head or neck surgery. She does have a sister with a thyroid goiter who is also not had surgery. Patient denies any signs or symptoms of hyperthyroidism or hypothyroidism. She denies tremors or palpitations. Patient presents today to discuss possible thyroidectomy.   Other Problems  Arthritis Back Pain Gastroesophageal Reflux Disease High blood pressure Migraine Headache Thyroid Disease  Past Surgical History Colon Polyp Removal - Colonoscopy Spinal Surgery - Lower Back Tonsillectomy  Diagnostic Studies History Colonoscopy 1-5 years ago Mammogram within last year Pap Smear 1-5 years ago  Allergies No Known Drug Allergies08/18/2017  Medication History Atenolol (25MG  Tablet, Oral) Active. HydroCHLOROthiazide (25MG  Tablet, Oral) Active. Omeprazole (40MG  Capsule DR, Oral) Active. Triamterene-HCTZ (37.5-25MG  Tablet, Oral) Active. ZyrTEC Allergy (10MG  Tablet, Oral) Active. Medications Reconciled  Social History Caffeine use Carbonated beverages. No alcohol use No drug use Tobacco use Never smoker.  Family History Alcohol Abuse Father. Arthritis Brother, Father, Mother. Breast Cancer Family Members In General. Cancer Mother. Cerebrovascular Accident Family Members In General. Diabetes Mellitus Family Members  In General. Hypertension Brother. Respiratory Condition Father. Thyroid problems Family Members In General, Sister.  Pregnancy / Birth History Age at menarche 13 years. Contraceptive History Oral contraceptives. Gravida 0 Irregular periods Para 0  Review of Systems General Present- Fatigue. Not Present- Appetite Loss, Chills, Fever, Night Sweats, Weight Gain and Weight Loss. Skin Present- Dryness. Not Present- Change in Wart/Mole, Hives, Jaundice, New Lesions, Non-Healing Wounds, Rash and Ulcer. HEENT Present- Seasonal Allergies and Wears glasses/contact lenses. Not Present- Earache, Hearing Loss, Hoarseness, Nose Bleed, Oral Ulcers, Ringing in the Ears, Sinus Pain, Sore Throat, Visual Disturbances and Yellow Eyes. Respiratory Not Present- Bloody sputum, Chronic Cough, Difficulty Breathing, Snoring and Wheezing. Breast Not Present- Breast Mass, Breast Pain, Nipple Discharge and Skin Changes. Cardiovascular Present- Swelling of Extremities. Not Present- Chest Pain, Difficulty Breathing Lying Down, Leg Cramps, Palpitations, Rapid Heart Rate and Shortness of Breath. Gastrointestinal Not Present- Abdominal Pain, Bloating, Bloody Stool, Change in Bowel Habits, Chronic diarrhea, Constipation, Difficulty Swallowing, Excessive gas, Gets full quickly at meals, Hemorrhoids, Indigestion, Nausea, Rectal Pain and Vomiting. Female Genitourinary Not Present- Frequency, Nocturia, Painful Urination, Pelvic Pain and Urgency. Musculoskeletal Present- Joint Pain, Joint Stiffness and Swelling of Extremities. Not Present- Back Pain, Muscle Pain and Muscle Weakness. Neurological Not Present- Decreased Memory, Fainting, Headaches, Numbness, Seizures, Tingling, Tremor, Trouble walking and Weakness. Psychiatric Not Present- Anxiety, Bipolar, Change in Sleep Pattern, Depression, Fearful and Frequent crying. Endocrine Not Present- Cold Intolerance, Excessive Hunger, Hair Changes, Heat Intolerance, Hot flashes  and New Diabetes. Hematology Not Present- Blood Thinners, Easy Bruising, Excessive bleeding, Gland problems, HIV and Persistent Infections.  Vitals Weight: 236 lb Height: 65in Body Surface Area: 2.12 m Body Mass Index: 39.27 kg/m  Temp.: 98.69F  Pulse: 78 (Regular)  BP: 130/70 (Sitting, Left Arm, Standard)  Physical Exam The physical exam findings are as follows: Note:General - appears comfortable, no distress; not  diaphorectic  HEENT - normocephalic; sclerae clear, gaze conjugate; mucous membranes moist, dentition good; voice normal  Neck - asymmetric on extension; no palpable anterior or posterior cervical adenopathy; large visible thyroid gland; palpation shows it to be smooth, firm, with left lobe measuring approximately 10 cm in size and right lobe measuring approximately 9 cm in size; both lobes extend slightly beneath the clavicle; dismisses thickened over the airway; gland is nontender  Chest - clear bilaterally without rhonchi, rales, or wheeze  Cor - regular rhythm with normal rate; no significant murmur  Ext - non-tender without significant edema or lymphedema  Neuro - grossly intact; no tremor   Assessment & Plan  THYROID GOITER (E04.9)  Patient presents on referral from her endocrinologist for evaluation of thyroid goiter. She has a long-standing thyroid goiter and his developed mild to moderate compressive symptoms. She presents today to discuss possible thyroidectomy.  Patient is presented with written literature on thyroid surgery to review at home.  Patient does have relative indications for thyroidectomy including mild to moderate compressive symptoms and significant size of her thyroid gland. We discussed total thyroidectomy as the procedure of choice. We discussed the location of the surgical incision, the potential for recurrent laryngeal nerve injury or injury to parathyroid glands, the need for lifelong thyroid hormone replacement, and the  postoperative recovery and return to activities. She understands.  The risks and benefits of the procedure have been discussed at length with the patient.  The patient understands the proposed procedure, potential alternative treatments, and the course of recovery to be expected.  All of the patient's questions have been answered at this time.  The patient wishes to proceed with surgery.  Velora Hecklerodd M. Nyelah Emmerich, MD, FACS General & Endocrine Surgery [facility] Office: [phone]

## 2016-03-14 LAB — HEMOGLOBIN A1C
HEMOGLOBIN A1C: 5.9 % — AB (ref 4.8–5.6)
Mean Plasma Glucose: 123 mg/dL

## 2016-03-15 ENCOUNTER — Observation Stay (HOSPITAL_COMMUNITY)
Admission: RE | Admit: 2016-03-15 | Discharge: 2016-03-16 | Disposition: A | Discharge status: Home / Self Care | Payer: 59 | Source: Ambulatory Visit | Attending: Surgery | Admitting: Surgery

## 2016-03-15 ENCOUNTER — Ambulatory Visit (HOSPITAL_COMMUNITY): Payer: 59 | Admitting: Anesthesiology

## 2016-03-15 ENCOUNTER — Encounter (HOSPITAL_COMMUNITY): Payer: Self-pay | Admitting: *Deleted

## 2016-03-15 ENCOUNTER — Encounter (HOSPITAL_COMMUNITY)
Admission: RE | Disposition: A | Discharge status: Home / Self Care | Payer: Self-pay | Source: Ambulatory Visit | Attending: Surgery

## 2016-03-15 DIAGNOSIS — E049 Nontoxic goiter, unspecified: Secondary | ICD-10-CM | POA: Diagnosis present

## 2016-03-15 DIAGNOSIS — Z86010 Personal history of colonic polyps: Secondary | ICD-10-CM | POA: Diagnosis not present

## 2016-03-15 DIAGNOSIS — I10 Essential (primary) hypertension: Secondary | ICD-10-CM | POA: Diagnosis not present

## 2016-03-15 DIAGNOSIS — R1310 Dysphagia, unspecified: Secondary | ICD-10-CM | POA: Diagnosis not present

## 2016-03-15 DIAGNOSIS — E042 Nontoxic multinodular goiter: Secondary | ICD-10-CM | POA: Diagnosis not present

## 2016-03-15 DIAGNOSIS — K219 Gastro-esophageal reflux disease without esophagitis: Secondary | ICD-10-CM | POA: Insufficient documentation

## 2016-03-15 DIAGNOSIS — M1990 Unspecified osteoarthritis, unspecified site: Secondary | ICD-10-CM | POA: Diagnosis not present

## 2016-03-15 HISTORY — PX: THYROIDECTOMY: SHX17

## 2016-03-15 LAB — POTASSIUM: POTASSIUM: 3.3 mmol/L — AB (ref 3.5–5.1)

## 2016-03-15 SURGERY — THYROIDECTOMY
Anesthesia: General

## 2016-03-15 MED ORDER — HYDROMORPHONE HCL 1 MG/ML IJ SOLN
0.2500 mg | INTRAMUSCULAR | Status: DC | PRN
  Administered 2016-03-15 (×4): 0.5 mg via INTRAVENOUS

## 2016-03-15 MED ORDER — FENTANYL CITRATE (PF) 100 MCG/2ML IJ SOLN
INTRAMUSCULAR | Status: AC
  Filled 2016-03-15: qty 4

## 2016-03-15 MED ORDER — ROCURONIUM BROMIDE 50 MG/5ML IV SOSY
PREFILLED_SYRINGE | INTRAVENOUS | Status: AC
  Filled 2016-03-15: qty 5

## 2016-03-15 MED ORDER — ACETAMINOPHEN 325 MG PO TABS: 650.0000 mg | ORAL_TABLET | Freq: Four times a day (QID) | ORAL | Status: DC | PRN

## 2016-03-15 MED ORDER — HYDROMORPHONE HCL 1 MG/ML IJ SOLN
INTRAMUSCULAR | Status: AC
  Administered 2016-03-15: 0.5 mg via INTRAVENOUS
  Filled 2016-03-15: qty 1

## 2016-03-15 MED ORDER — CEFAZOLIN SODIUM-DEXTROSE 2-4 GM/100ML-% IV SOLN
INTRAVENOUS | Status: AC
  Filled 2016-03-15: qty 100

## 2016-03-15 MED ORDER — MIDAZOLAM HCL 5 MG/5ML IJ SOLN
INTRAMUSCULAR | Status: DC | PRN
  Administered 2016-03-15: 2 mg via INTRAVENOUS

## 2016-03-15 MED ORDER — KCL IN DEXTROSE-NACL 20-5-0.45 MEQ/L-%-% IV SOLN
INTRAVENOUS | Status: DC
  Administered 2016-03-15: 17:00:00 via INTRAVENOUS
  Filled 2016-03-15 (×2): qty 1000

## 2016-03-15 MED ORDER — PROPOFOL 10 MG/ML IV BOLUS
INTRAVENOUS | Status: AC
Start: 2016-03-15 — End: 2016-03-15
  Filled 2016-03-15: qty 20

## 2016-03-15 MED ORDER — MIDAZOLAM HCL 2 MG/2ML IJ SOLN
INTRAMUSCULAR | Status: AC
  Filled 2016-03-15: qty 2

## 2016-03-15 MED ORDER — PROMETHAZINE HCL 25 MG/ML IJ SOLN: 6.2500 mg | INTRAMUSCULAR | Status: DC | PRN

## 2016-03-15 MED ORDER — 0.9 % SODIUM CHLORIDE (POUR BTL) OPTIME
TOPICAL | Status: DC | PRN
  Administered 2016-03-15: 1000 mL

## 2016-03-15 MED ORDER — ONDANSETRON HCL 4 MG/2ML IJ SOLN
4.0000 mg | Freq: Four times a day (QID) | INTRAMUSCULAR | Status: DC | PRN
  Administered 2016-03-15: 4 mg via INTRAVENOUS
  Filled 2016-03-15: qty 2

## 2016-03-15 MED ORDER — LIDOCAINE 2% (20 MG/ML) 5 ML SYRINGE
INTRAMUSCULAR | Status: DC | PRN
  Administered 2016-03-15: 60 mg via INTRAVENOUS

## 2016-03-15 MED ORDER — CEFAZOLIN SODIUM-DEXTROSE 2-4 GM/100ML-% IV SOLN
2.0000 g | INTRAVENOUS | Status: AC
Start: 2016-03-15 — End: 2016-03-15
  Administered 2016-03-15: 2 g via INTRAVENOUS

## 2016-03-15 MED ORDER — EPHEDRINE SULFATE-NACL 50-0.9 MG/10ML-% IV SOSY
PREFILLED_SYRINGE | INTRAVENOUS | Status: DC | PRN
  Administered 2016-03-15: 10 mg via INTRAVENOUS
  Administered 2016-03-15: 15 mg via INTRAVENOUS

## 2016-03-15 MED ORDER — SUCCINYLCHOLINE CHLORIDE 20 MG/ML IJ SOLN
INTRAMUSCULAR | Status: DC | PRN
  Administered 2016-03-15: 100 mg via INTRAVENOUS

## 2016-03-15 MED ORDER — PROPOFOL 10 MG/ML IV BOLUS
INTRAVENOUS | Status: DC | PRN
  Administered 2016-03-15: 200 mg via INTRAVENOUS

## 2016-03-15 MED ORDER — ROCURONIUM BROMIDE 10 MG/ML (PF) SYRINGE
PREFILLED_SYRINGE | INTRAVENOUS | Status: DC | PRN
  Administered 2016-03-15: 20 mg via INTRAVENOUS
  Administered 2016-03-15: 40 mg via INTRAVENOUS

## 2016-03-15 MED ORDER — ONDANSETRON 4 MG PO TBDP: 4.0000 mg | ORAL_TABLET | Freq: Four times a day (QID) | ORAL | Status: DC | PRN

## 2016-03-15 MED ORDER — EPHEDRINE 5 MG/ML INJ
INTRAVENOUS | Status: AC
  Filled 2016-03-15: qty 10

## 2016-03-15 MED ORDER — SUGAMMADEX SODIUM 200 MG/2ML IV SOLN
INTRAVENOUS | Status: DC | PRN
  Administered 2016-03-15: 200 mg via INTRAVENOUS

## 2016-03-15 MED ORDER — CALCIUM CARBONATE 1250 (500 CA) MG PO TABS
2.0000 | ORAL_TABLET | Freq: Three times a day (TID) | ORAL | Status: DC
  Administered 2016-03-15: 1000 mg via ORAL
  Administered 2016-03-16 (×2): 500 mg via ORAL
  Filled 2016-03-15 (×4): qty 1

## 2016-03-15 MED ORDER — ATENOLOL 50 MG PO TABS
25.0000 mg | ORAL_TABLET | Freq: Every day | ORAL | Status: DC
Start: 2016-03-16 — End: 2016-03-16
  Administered 2016-03-16: 25 mg via ORAL
  Filled 2016-03-15: qty 1

## 2016-03-15 MED ORDER — ONDANSETRON HCL 4 MG/2ML IJ SOLN
INTRAMUSCULAR | Status: DC | PRN
  Administered 2016-03-15: 4 mg via INTRAVENOUS

## 2016-03-15 MED ORDER — FENTANYL CITRATE (PF) 100 MCG/2ML IJ SOLN
INTRAMUSCULAR | Status: DC | PRN
  Administered 2016-03-15: 50 ug via INTRAVENOUS
  Administered 2016-03-15: 100 ug via INTRAVENOUS
  Administered 2016-03-15: 50 ug via INTRAVENOUS

## 2016-03-15 MED ORDER — HYDROMORPHONE HCL 1 MG/ML IJ SOLN
1.0000 mg | INTRAMUSCULAR | Status: DC | PRN
  Administered 2016-03-15: 1 mg via INTRAVENOUS
  Filled 2016-03-15: qty 1

## 2016-03-15 MED ORDER — ACETAMINOPHEN 650 MG RE SUPP: 650.0000 mg | Freq: Four times a day (QID) | RECTAL | Status: DC | PRN

## 2016-03-15 MED ORDER — LACTATED RINGERS IV SOLN
INTRAVENOUS | Status: DC
  Administered 2016-03-15: 11:00:00 via INTRAVENOUS
  Administered 2016-03-15: 1000 mL via INTRAVENOUS

## 2016-03-15 MED ORDER — HYDROCODONE-ACETAMINOPHEN 5-325 MG PO TABS
1.0000 | ORAL_TABLET | ORAL | Status: DC | PRN
  Administered 2016-03-15: 1 via ORAL
  Filled 2016-03-15: qty 2

## 2016-03-15 SURGICAL SUPPLY — 37 items
APL SKNCLS STERI-STRIP NONHPOA (GAUZE/BANDAGES/DRESSINGS)
ATTRACTOMAT 16X20 MAGNETIC DRP (DRAPES) ×2 IMPLANT
BENZOIN TINCTURE PRP APPL 2/3 (GAUZE/BANDAGES/DRESSINGS) IMPLANT
BLADE SURG 15 STRL LF DISP TIS (BLADE) ×1 IMPLANT
BLADE SURG 15 STRL SS (BLADE) ×2
CHLORAPREP W/TINT 26ML (MISCELLANEOUS) ×3 IMPLANT
CLIP TI MEDIUM 6 (CLIP) ×8 IMPLANT
CLIP TI WIDE RED SMALL 6 (CLIP) ×6 IMPLANT
DISSECTOR ROUND CHERRY 3/8 STR (MISCELLANEOUS) IMPLANT
DRAPE LAPAROTOMY T 98X78 PEDS (DRAPES) ×2 IMPLANT
ELECT PENCIL ROCKER SW 15FT (MISCELLANEOUS) ×2 IMPLANT
ELECT REM PT RETURN 9FT ADLT (ELECTROSURGICAL) ×2
ELECTRODE REM PT RTRN 9FT ADLT (ELECTROSURGICAL) ×1 IMPLANT
GAUZE SPONGE 4X4 12PLY STRL (GAUZE/BANDAGES/DRESSINGS) ×1 IMPLANT
GAUZE SPONGE 4X4 16PLY XRAY LF (GAUZE/BANDAGES/DRESSINGS) ×4 IMPLANT
GLOVE BIOGEL PI IND STRL 6.5 (GLOVE) IMPLANT
GLOVE BIOGEL PI INDICATOR 6.5 (GLOVE) ×6
GLOVE SURG ORTHO 8.0 STRL STRW (GLOVE) ×3 IMPLANT
GOWN STRL REUS W/TWL XL LVL3 (GOWN DISPOSABLE) ×6 IMPLANT
HEMOSTAT SURGICEL 2X4 FIBR (HEMOSTASIS) ×2 IMPLANT
ILLUMINATOR WAVEGUIDE N/F (MISCELLANEOUS) ×1 IMPLANT
KIT BASIN OR (CUSTOM PROCEDURE TRAY) ×2 IMPLANT
LIGHT WAVEGUIDE WIDE FLAT (MISCELLANEOUS) IMPLANT
PACK BASIC VI WITH GOWN DISP (CUSTOM PROCEDURE TRAY) ×2 IMPLANT
SHEARS HARMONIC 9CM CVD (BLADE) ×2 IMPLANT
STAPLER VISISTAT 35W (STAPLE) IMPLANT
STRIP CLOSURE SKIN 1/2X4 (GAUZE/BANDAGES/DRESSINGS) ×2 IMPLANT
SUT MNCRL AB 4-0 PS2 18 (SUTURE) ×2 IMPLANT
SUT SILK 2 0 (SUTURE)
SUT SILK 2-0 18XBRD TIE 12 (SUTURE) IMPLANT
SUT SILK 3 0 (SUTURE)
SUT SILK 3-0 18XBRD TIE 12 (SUTURE) IMPLANT
SUT VIC AB 3-0 SH 18 (SUTURE) ×4 IMPLANT
SYR BULB IRRIGATION 50ML (SYRINGE) ×2 IMPLANT
TOWEL OR 17X26 10 PK STRL BLUE (TOWEL DISPOSABLE) ×2 IMPLANT
TOWEL OR NON WOVEN STRL DISP B (DISPOSABLE) ×2 IMPLANT
YANKAUER SUCT BULB TIP 10FT TU (MISCELLANEOUS) ×2 IMPLANT

## 2016-03-15 NOTE — Anesthesia Procedure Notes (Signed)
Procedure Name: Intubation Performed by: Kizzie FantasiaARVER, Vasiliki Smaldone J Pre-anesthesia Checklist: Patient identified, Emergency Drugs available, Suction available, Patient being monitored and Timeout performed Patient Re-evaluated:Patient Re-evaluated prior to inductionOxygen Delivery Method: Circle system utilized Preoxygenation: Pre-oxygenation with 100% oxygen Intubation Type: IV induction Ventilation: Mask ventilation without difficulty Laryngoscope Size: Mac and 4 Grade View: Grade III Tube type: Oral Tube size: 7.0 mm Number of attempts: 1 Airway Equipment and Method: Stylet Placement Confirmation: ETT inserted through vocal cords under direct vision,  positive ETCO2,  CO2 detector and breath sounds checked- equal and bilateral Secured at: 21 cm Tube secured with: Tape Dental Injury: Teeth and Oropharynx as per pre-operative assessment  Future Recommendations: Recommend- induction with short-acting agent, and alternative techniques readily available

## 2016-03-15 NOTE — Transfer of Care (Signed)
Immediate Anesthesia Transfer of Care Note  Patient: Randa SpikeJane A Amrhein  Procedure(s) Performed: Procedure(s): TOTAL THYROIDECTOMY (N/A)  Patient Location: PACU  Anesthesia Type:General  Level of Consciousness: awake, alert  and oriented  Airway & Oxygen Therapy: Patient Spontanous Breathing and Patient connected to face mask oxygen  Post-op Assessment: Report given to RN and Post -op Vital signs reviewed and stable  Post vital signs: Reviewed and stable  Last Vitals:  Vitals:   03/15/16 0818  BP: 133/81  Pulse: 79  Resp: 18  Temp: 36.7 C    Last Pain:  Vitals:   03/15/16 0818  TempSrc: Oral      Patients Stated Pain Goal: 4 (03/15/16 16100832)  Complications: No apparent anesthesia complications

## 2016-03-15 NOTE — Op Note (Signed)
Procedure Note  Pre-operative Diagnosis:  Multinodular thyroid goiter with compressive symptoms  Post-operative Diagnosis:  same  Surgeon:  Velora Hecklerodd M. Nitzia Perren, MD, FACS  Assistant:  Feliciana RossettiLuke Kinsinger, MD   Procedure:  Total thyroidectomy  Anesthesia:  General  Estimated Blood Loss:  minimal  Drains: none         Specimen: thyroid to pathology  Indications:  The patient is a 55 year old female who presents with a complaint of Enlarged thyroid.  Patient referred by Dr. Talmage CoinJeffrey Kerr for surgical evaluation of multinodular thyroid goiter. Patient states that she has had a large goiter for over 10 years. She has mild compressive symptoms with dysphagia of both solids and liquids and has recently developed some bilateral anterior and supraclavicular neck discomfort. Patient has never been on thyroid medication. She has had no prior head or neck surgery. She does have a sister with a thyroid goiter who is also not had surgery. Patient denies any signs or symptoms of hyperthyroidism or hypothyroidism. She denies tremors or palpitations. Patient presents today to discuss possible thyroidectomy.  Procedure Details: Procedure [facility]  The patient was brought to the operating room and placed in a supine position on the operating room table.  Following administration of general anesthesia, the patient was positioned and then prepped and draped in the usual aseptic fashion.  After ascertaining that an adequate level of anesthesia had been achieved, a Kocher incision was made with #15 blade.  Dissection was carried through subcutaneous tissues and platysma. Hemostasis was achieved with the electrocautery.  Skin flaps were elevated cephalad and caudad from the thyroid notch to the sternal notch.  The Mahorner self-retaining retractor was placed for exposure.  Strap muscles were incised in the midline and dissection was begun on the left side.  Strap muscles were  reflected laterally.  Left thyroid lobe was markedly enlarged and multinodular.  The left lobe was gently mobilized with blunt dissection.  Superior pole vessels were dissected out and divided individually between small and medium Ligaclips with the Harmonic scalpel.  The thyroid lobe was rolled anteriorly.  Branches of the inferior thyroid artery were divided between small Ligaclips with the Harmonic scalpel.  Inferior venous tributaries were divided between Ligaclips.  Both the superior and inferior parathyroid glands were identified and preserved on their vascular pedicles.  The recurrent laryngeal nerve was identified and preserved along its course.  The ligament of Allyson SabalBerry was released with the electrocautery and the gland was mobilized onto the anterior trachea. Isthmus was mobilized across the midline.  There was a moderate sized pyramidal lobe present which was dissected off of the thyroid cartilage and resected with the isthmus.  Dry pack was placed in the left neck.  Next, the right thyroid lobe was gently mobilized with blunt dissection.  Right thyroid lobe was also markedly enlarged and multinodular.  Superior pole vessels were dissected out and divided between small and medium Ligaclips with the Harmonic scalpel.  Superior parathyroid was identified and preserved.  Inferior venous tributaries were divided between medium Ligaclips with the Harmonic scalpel.  The right thyroid lobe was rolled anteriorly and the branches of the inferior thyroid artery divided between small Ligaclips.  The right recurrent laryngeal nerve was identified and preserved along its course.  The ligament of Allyson SabalBerry was released with the electrocautery.  The right thyroid lobe was mobilized onto the anterior trachea and the remainder of the thyroid was dissected off the anterior trachea  and the thyroid was completely excised.  A suture was used to mark the right lobe. The entire thyroid gland was submitted to pathology for  review.  The neck was irrigated with warm saline.  Fibrillar was placed throughout the operative field.  Strap muscles were reapproximated in the midline with interrupted 3-0 Vicryl sutures.  Platysma was closed with interrupted 3-0 Vicryl sutures.  Skin was closed with a running 4-0 Monocryl subcuticular suture.  Wound was washed and dried and steri-strips were applied.  Dry gauze dressing was placed.  The patient was awakened from anesthesia and brought to the recovery room.  The patient tolerated the procedure well.   Velora Hecklerodd M. Shakura Cowing, MD, FACS General & Endocrine Surgery [facility] Office: [phone]

## 2016-03-15 NOTE — Anesthesia Postprocedure Evaluation (Signed)
Anesthesia Post Note  Patient: Randa SpikeJane A Goette  Procedure(s) Performed: Procedure(s) (LRB): TOTAL THYROIDECTOMY (N/A)  Patient location during evaluation: PACU Anesthesia Type: General Level of consciousness: awake and sedated Pain management: pain level controlled Vital Signs Assessment: post-procedure vital signs reviewed and stable Respiratory status: spontaneous breathing, nonlabored ventilation, respiratory function stable and patient connected to nasal cannula oxygen Cardiovascular status: blood pressure returned to baseline and stable Postop Assessment: no signs of nausea or vomiting Anesthetic complications: no    Last Vitals:  Vitals:   03/15/16 1500 03/15/16 1600  BP: (!) 125/95 122/80  Pulse: 61 66  Resp: 16 17  Temp: 36.7 C 36.7 C    Last Pain:  Vitals:   03/15/16 1600  TempSrc: Oral  PainSc:                  Jakyri Brunkhorst,JAMES TERRILL

## 2016-03-15 NOTE — Interval H&P Note (Signed)
History and Physical Interval Note:  03/15/2016 10:12 AM  Tara Hill  has presented today for surgery, with the diagnosis of THYROID GOITER WITH COMPRESSIVE SYMPTOMS.  The various methods of treatment have been discussed with the patient and family. After consideration of risks, benefits and other options for treatment, the patient has consented to    Procedure(s): TOTAL THYROIDECTOMY (N/A) as a surgical intervention .    The patient's history has been reviewed, patient examined, no change in status, stable for surgery.  I have reviewed the patient's chart and labs.  Questions were answered to the patient's satisfaction.    Velora Hecklerodd M. Cruz Devilla, MD, [facility] Office: [phone]    Tara Hill

## 2016-03-15 NOTE — Anesthesia Preprocedure Evaluation (Signed)
Anesthesia Evaluation  Patient identified by MRN, date of birth, ID band Patient awake    Reviewed: Allergy & Precautions, NPO status , Patient's Chart, lab work & pertinent test results  Airway Mallampati: II  TM Distance: >3 FB Neck ROM: Full    Dental  (+) Teeth Intact   Pulmonary shortness of breath,    breath sounds clear to auscultation       Cardiovascular hypertension,  Rhythm:Regular Rate:Normal     Neuro/Psych negative neurological ROS     GI/Hepatic Neg liver ROS, GERD  ,  Endo/Other  negative endocrine ROS  Renal/GU negative Renal ROS     Musculoskeletal  (+) Arthritis ,   Abdominal   Peds  Hematology negative hematology ROS (+)   Anesthesia Other Findings   Reproductive/Obstetrics                             Anesthesia Physical Anesthesia Plan  ASA: II  Anesthesia Plan: General   Post-op Pain Management:    Induction: Intravenous  Airway Management Planned: Oral ETT  Additional Equipment:   Intra-op Plan:   Post-operative Plan: Extubation in OR  Informed Consent: I have reviewed the patients History and Physical, chart, labs and discussed the procedure including the risks, benefits and alternatives for the proposed anesthesia with the patient or authorized representative who has indicated his/her understanding and acceptance.   Dental advisory given  Plan Discussed with: CRNA  Anesthesia Plan Comments:         Anesthesia Quick Evaluation

## 2016-03-16 DIAGNOSIS — E042 Nontoxic multinodular goiter: Secondary | ICD-10-CM | POA: Diagnosis not present

## 2016-03-16 LAB — BASIC METABOLIC PANEL
Anion gap: 8 (ref 5–15)
BUN: 7 mg/dL (ref 6–20)
CALCIUM: 8 mg/dL — AB (ref 8.9–10.3)
CO2: 31 mmol/L (ref 22–32)
CREATININE: 0.73 mg/dL (ref 0.44–1.00)
Chloride: 102 mmol/L (ref 101–111)
Glucose, Bld: 130 mg/dL — ABNORMAL HIGH (ref 65–99)
Potassium: 3 mmol/L — ABNORMAL LOW (ref 3.5–5.1)
SODIUM: 141 mmol/L (ref 135–145)

## 2016-03-16 MED ORDER — SYNTHROID 100 MCG PO TABS: 100.0000 ug | ORAL_TABLET | Freq: Every day | ORAL | 3 refills | Status: AC

## 2016-03-16 MED ORDER — HYDROCODONE-ACETAMINOPHEN 5-325 MG PO TABS: 1.0000 | ORAL_TABLET | ORAL | 0 refills | Status: AC | PRN

## 2016-03-16 MED ORDER — SODIUM CHLORIDE 0.9 % IV SOLN
2.0000 g | INTRAVENOUS | Status: AC
  Administered 2016-03-16: 2 g via INTRAVENOUS
  Filled 2016-03-16: qty 20

## 2016-03-16 MED ORDER — CALCIUM CARBONATE ANTACID 500 MG PO CHEW: 3.0000 | CHEWABLE_TABLET | Freq: Three times a day (TID) | ORAL | 1 refills | Status: AC

## 2016-03-16 NOTE — Discharge Summary (Signed)
Physician Discharge Summary [facility]  Patient ID: Tara Hill MRN: [id number] DOB/AGE: 1961/04/23 55 y.o.  Admit date: 03/15/2016 Discharge date: 03/16/2016  Admission Diagnoses:  Thyroid goiter  Discharge Diagnoses:  Principal Problem:   Thyroid goiter   Discharged Condition: good  Hospital Course: Patient was admitted for observation following thyroid surgery.  Post op course was uncomplicated.  Pain was well controlled.  Tolerated diet.  Post op calcium level on morning following surgery was 8.0 mg/dl.  Calcium gluconate 2 gm IV was given prior to discharge on 03/16/2016.  Patient was prepared for discharge home on POD#1.  Consults: None  Treatments: surgery: total thyroidectomy  Discharge Exam: Blood pressure 126/72, pulse 78, temperature 98.6 F (37 C), temperature source Oral, resp. rate 17, height 5\' 5"  (1.651 m), weight 95.7 kg (211 lb), last menstrual period 02/03/2016, SpO2 99 %. HEENT - clear Neck - wound dry and intact; voice with mild hoarseness; wound slight STS  Disposition: Home  Discharge Instructions    Apply dressing    Complete by:  As directed    Apply light gauze dressing to wound before discharge home today.   Diet - low sodium heart healthy    Complete by:  As directed    Discharge instructions    Complete by:  As directed    [facility]  THYROID & PARATHYROID SURGERY:  POST-OP INSTRUCTIONS  Always review your discharge instruction sheet from the facility where your surgery was performed.  A prescription for pain medication may be given to you upon discharge.  Take your pain medication as prescribed.  If narcotic pain medicine is not needed, then you may take acetaminophen (Tylenol) or ibuprofen (Advil) as needed.  Take your usually prescribed medications unless otherwise directed.  If you need a refill on your pain medication, please contact your pharmacy. They will contact our office to request  authorization.  Prescriptions will not be processed by our office after 5 pm or on weekends.  Start with a light diet upon arrival home, such as soup and crackers or toast.  Be sure to drink plenty of fluids daily.  Resume your normal diet the day after surgery.  Most patients will experience some swelling and bruising on the chest and neck area.  Ice packs will help.  Swelling and bruising can take several days to resolve.   It is common to experience some constipation after surgery.  Increasing fluid intake and taking a stool softener will usually help or prevent this problem.  A mild laxative (Milk of Magnesia or Miralax) should be taken according to package directions if there has been no bowel movement after 48 hours.  You have steri-strips and a gauze dressing over your incision.  You may remove the gauze bandage on the second day after surgery, and you may shower at that time.  Leave your steri-strips (small skin tapes) in place directly over the incision.  These strips should remain on the skin for 5-7 days and then be removed.  You may get them wet in the shower and pat them dry.  You may resume regular (light) daily activities beginning the next day - such as daily self-care, walking, climbing stairs - gradually increasing activities as tolerated.  You may have sexual intercourse when it is comfortable.  Refrain from any heavy lifting or straining until approved by your doctor.  You may drive when you no longer are taking prescription pain medication, you can comfortably wear a  seatbelt, and you can safely maneuver your car and apply brakes.  You should see your doctor in the office for a follow-up appointment approximately two to three weeks after your surgery.  Make sure that you call for this appointment within a day or two after you arrive home to insure a convenient appointment time.  WHEN TO CALL YOUR DOCTOR: -- Fever greater than 101.5 -- Inability to urinate -- Nausea and/or  vomiting - persistent -- Extreme swelling or bruising -- Continued bleeding from incision -- Increased pain, redness, or drainage from the incision -- Difficulty swallowing or breathing -- Muscle cramping or spasms -- Numbness or tingling in hands or around lips  The clinic staff is available to answer your questions during regular business hours.  Please don't hesitate to call and ask to speak to one of the nurses if you have concerns.  Velora Hecklerodd M. Casia Corti, MD, FACS General & Endocrine Surgery [facility] Office: [phone]  Website: [email or web address]   Increase activity slowly    Complete by:  As directed    Remove dressing in 24 hours    Complete by:  As directed        Medication List    TAKE these medications   atenolol 25 MG tablet Commonly known as:  TENORMIN Take 25 mg by mouth daily.   calcium carbonate 500 MG chewable tablet Commonly known as:  TUMS Chew 3 tablets (600 mg of elemental calcium total) by mouth 3 (three) times daily.   cetirizine 10 MG tablet Commonly known as:  ZYRTEC Take 10 mg by mouth daily.   hydrochlorothiazide 25 MG tablet Commonly known as:  HYDRODIURIL Take 25 mg by mouth daily.   HYDROcodone-acetaminophen 5-325 MG tablet Commonly known as:  NORCO/VICODIN Take 1-2 tablets by mouth every 4 (four) hours as needed for moderate pain.   omeprazole 40 MG capsule Commonly known as:  PRILOSEC Take 40 mg by mouth daily.   SYNTHROID 100 MCG tablet Generic drug:  levothyroxine Take 1 tablet (100 mcg total) by mouth daily.   triamterene-hydrochlorothiazide 37.5-25 MG tablet Commonly known as:  MAXZIDE-25 Take 1 tablet by mouth daily.      Follow-up Information    Kahner Yanik M, MD. Schedule an appointment as soon as possible for a visit in 3 week(s).   Specialty:  General Surgery Contact information: [address] [place] [address] [phone]           Velora Hecklerodd M. Gust Eugene, MD,  [facility] Office: [phone]   Signed: Velora HecklerGERKIN,Tara Hill M 03/16/2016, 11:51 AM

## 2016-03-16 NOTE — Progress Notes (Addendum)
Patient verbalized understanding of discharge instructions. Patient is stable at discharge. Notified Attending Doctor of patient request to have prescription faxed to local Pharmacy.

## 2016-03-17 ENCOUNTER — Encounter (HOSPITAL_BASED_OUTPATIENT_CLINIC_OR_DEPARTMENT_OTHER): Payer: Self-pay | Admitting: Emergency Medicine

## 2016-03-17 ENCOUNTER — Emergency Department (HOSPITAL_BASED_OUTPATIENT_CLINIC_OR_DEPARTMENT_OTHER)
Admission: EM | Admit: 2016-03-17 | Discharge: 2016-03-17 | Disposition: A | Discharge status: Home / Self Care | Payer: 59 | Attending: Emergency Medicine | Admitting: Emergency Medicine

## 2016-03-17 DIAGNOSIS — E876 Hypokalemia: Secondary | ICD-10-CM | POA: Insufficient documentation

## 2016-03-17 DIAGNOSIS — R200 Anesthesia of skin: Secondary | ICD-10-CM | POA: Diagnosis present

## 2016-03-17 DIAGNOSIS — Z79899 Other long term (current) drug therapy: Secondary | ICD-10-CM | POA: Insufficient documentation

## 2016-03-17 DIAGNOSIS — Z9089 Acquired absence of other organs: Secondary | ICD-10-CM | POA: Insufficient documentation

## 2016-03-17 DIAGNOSIS — R202 Paresthesia of skin: Secondary | ICD-10-CM

## 2016-03-17 DIAGNOSIS — I10 Essential (primary) hypertension: Secondary | ICD-10-CM | POA: Diagnosis not present

## 2016-03-17 DIAGNOSIS — M6240 Contracture of muscle, unspecified site: Secondary | ICD-10-CM

## 2016-03-17 LAB — CBC WITH DIFFERENTIAL/PLATELET
BASOS ABS: 0 10*3/uL (ref 0.0–0.1)
Basophils Relative: 0 %
EOS PCT: 0 %
Eosinophils Absolute: 0.1 10*3/uL (ref 0.0–0.7)
HEMATOCRIT: 32.9 % — AB (ref 36.0–46.0)
Hemoglobin: 10.7 g/dL — ABNORMAL LOW (ref 12.0–15.0)
LYMPHS PCT: 15 %
Lymphs Abs: 2 10*3/uL (ref 0.7–4.0)
MCH: 25.4 pg — ABNORMAL LOW (ref 26.0–34.0)
MCHC: 32.5 g/dL (ref 30.0–36.0)
MCV: 78.1 fL (ref 78.0–100.0)
Monocytes Absolute: 1 10*3/uL (ref 0.1–1.0)
Monocytes Relative: 8 %
NEUTROS ABS: 9.9 10*3/uL — AB (ref 1.7–7.7)
NEUTROS PCT: 77 %
PLATELETS: 337 10*3/uL (ref 150–400)
RBC: 4.21 MIL/uL (ref 3.87–5.11)
RDW: 15.2 % (ref 11.5–15.5)
WBC: 13 10*3/uL — AB (ref 4.0–10.5)

## 2016-03-17 LAB — BASIC METABOLIC PANEL
ANION GAP: 7 (ref 5–15)
BUN: 10 mg/dL (ref 6–20)
CO2: 31 mmol/L (ref 22–32)
Calcium: 8.1 mg/dL — ABNORMAL LOW (ref 8.9–10.3)
Chloride: 101 mmol/L (ref 101–111)
Creatinine, Ser: 0.72 mg/dL (ref 0.44–1.00)
GFR calc Af Amer: >60 mL/min (ref >60)
GLUCOSE: 125 mg/dL — AB (ref 65–99)
POTASSIUM: 2.7 mmol/L — AB (ref 3.5–5.1)
Sodium: 139 mmol/L (ref 135–145)

## 2016-03-17 LAB — MAGNESIUM: Magnesium: 2 mg/dL (ref 1.7–2.4)

## 2016-03-17 MED ORDER — POTASSIUM CHLORIDE 10 MEQ/100ML IV SOLN
INTRAVENOUS | Status: AC
  Administered 2016-03-17: 10 meq
  Filled 2016-03-17: qty 100

## 2016-03-17 MED ORDER — POTASSIUM CHLORIDE CRYS ER 20 MEQ PO TBCR
40.0000 meq | EXTENDED_RELEASE_TABLET | Freq: Once | ORAL | Status: AC
  Administered 2016-03-17: 40 meq via ORAL
  Filled 2016-03-17: qty 2

## 2016-03-17 MED ORDER — POTASSIUM CHLORIDE ER 10 MEQ PO TBCR: 10.0000 meq | EXTENDED_RELEASE_TABLET | Freq: Every day | ORAL | 0 refills | Status: AC

## 2016-03-17 MED ORDER — POTASSIUM CHLORIDE 10 MEQ/100ML IV SOLN
10.0000 meq | INTRAVENOUS | Status: AC
  Administered 2016-03-17 (×2): 10 meq via INTRAVENOUS
  Filled 2016-03-17: qty 100

## 2016-03-17 MED ORDER — SODIUM CHLORIDE 0.9 % IV SOLN
1.0000 g | Freq: Once | INTRAVENOUS | Status: AC
  Administered 2016-03-17: 1 g via INTRAVENOUS
  Filled 2016-03-17: qty 10

## 2016-03-17 MED ORDER — MAGNESIUM SULFATE 2 GM/50ML IV SOLN
2.0000 g | Freq: Once | INTRAVENOUS | Status: AC
  Administered 2016-03-17: 2 g via INTRAVENOUS
  Filled 2016-03-17: qty 50

## 2016-03-17 NOTE — ED Triage Notes (Addendum)
Thyroidectomy on Thursday, pt reports both hands became numb were contracting at 7:00 this morning. Denies pain, denies extremity weakness. Pt took first dose of levothyroxine this morning at 6:00

## 2016-03-17 NOTE — Discharge Instructions (Signed)
1. Take 1000mg  calcium when you wake up and 1500mg  calcium before bed. 2. Take levothyroxine at 2:00pm, or approximately 3 hours after lunch.  3. Take potassium 1 tablet every morning for the next 4 days.

## 2016-03-17 NOTE — ED Provider Notes (Signed)
[facility] Provider Note   CSN: [id number] Arrival date & time: 03/17/16  [id number]     History   Chief Complaint Chief Complaint  Patient presents with  . Numbness    HPI Tara Hill is a 55 y.o. female.  55yo F w/ PMH below who p/w bilateral hand numbness and contractions. Pt had total thyroidectomy 2 days ago. Surgery was uncomplicated and she went home yesterday morning after receiving dose of IV calcium. She was told to start taking calcium 3 times daily. She took a dose last night but has not taken a dose this morning because her levothyroxine prescription says that she should not take calcium within 4 hours of that medication. Overnight she had bilateral hand numbness which she initially attributed to carpal tunnel syndrome. Her hands have been persistently numb this morning and began contracting around 7 AM. She denies any pain or extremity weakness. No shortness of breath or drainage from her wound. No fevers or other complaints.   The history is provided by the patient.    Past Medical History:  Diagnosis Date  . Allergic rhinitis   . Allergy   . Arthritis   . Dyspnea    due to allergies   . GERD (gastroesophageal reflux disease)   . Heart murmur   . Hypertension   . Pre-diabetes   . Thyroid disease     Patient Active Problem List   Diagnosis Date Noted  . Thyroid goiter 03/13/2016  . TMJ (temporomandibular joint syndrome) 08/28/2013  . Back pain 04/14/2013  . Leukocytosis 04/16/2012  . Hypertension   . THYROID NODULE 05/12/2007    Past Surgical History:  Procedure Laterality Date  . BACK SURGERY  10/28/2014  . THYROIDECTOMY    . TONSILLECTOMY AND ADENOIDECTOMY      OB History    No data available       Home Medications    Prior to Admission medications   Medication Sig Start Date End Date Taking? Authorizing Provider  atenolol (TENORMIN) 25 MG tablet Take 25 mg by mouth daily.    Historical Provider, MD  calcium carbonate  (TUMS) 500 MG chewable tablet Chew 3 tablets (600 mg of elemental calcium total) by mouth 3 (three) times daily. 03/16/16   Darnell Levelodd Gerkin, MD  cetirizine (ZYRTEC) 10 MG tablet Take 10 mg by mouth daily.    Historical Provider, MD  hydrochlorothiazide (HYDRODIURIL) 25 MG tablet Take 25 mg by mouth daily.    Historical Provider, MD  HYDROcodone-acetaminophen (NORCO/VICODIN) 5-325 MG tablet Take 1-2 tablets by mouth every 4 (four) hours as needed for moderate pain. 03/16/16   Darnell Levelodd Gerkin, MD  omeprazole (PRILOSEC) 40 MG capsule Take 40 mg by mouth daily.    Historical Provider, MD  potassium chloride (K-DUR) 10 MEQ tablet Take 1 tablet (10 mEq total) by mouth daily. 03/17/16   Laurence Spatesachel Morgan Little, MD  SYNTHROID 100 MCG tablet Take 1 tablet (100 mcg total) by mouth daily. 03/16/16   Darnell Levelodd Gerkin, MD  triamterene-hydrochlorothiazide (MAXZIDE-25) 37.5-25 MG per tablet Take 1 tablet by mouth daily. Patient not taking: Reported on 03/08/2016 11/26/14   Elvina SidleKurt Lauenstein, MD    Family History Family History  Problem Relation Age of Onset  . Cancer Mother 6660    lymphoma  . COPD Father   . Thyroid disease Sister   . Hypertension Brother   . Sleep apnea Brother     Social History Social History  Substance Use Topics  . Smoking status: Never  Smoker  . Smokeless tobacco: Never Used  . Alcohol use No     Allergies   Patient has no known allergies.   Review of Systems Review of Systems 10 Systems reviewed and are negative for acute change except as noted in the HPI.   Physical Exam Updated Vital Signs BP 115/74 (BP Location: Right Arm)   Pulse 78   Temp 97.7 F (36.5 C) (Oral)   Resp 18   Ht 5\' 5"  (1.651 m)   Wt 214 lb (97.1 kg)   LMP 02/03/2016   SpO2 100%   BMI 35.61 kg/m   Physical Exam  Constitutional: She is oriented to person, place, and time. She appears well-developed and well-nourished. No distress.  HENT:  Head: Normocephalic and atraumatic.  Moist mucous membranes    Eyes: Conjunctivae are normal. Pupils are equal, round, and reactive to light.  Neck: Neck supple. No tracheal deviation present.  Surgical incision over base of anterior neck w/ steri strips in place, clean/dry/intact; no crepitus  Cardiovascular: Normal rate, regular rhythm and normal heart sounds.   No murmur heard. Pulmonary/Chest: Effort normal and breath sounds normal. No stridor.  Abdominal: Soft. Bowel sounds are normal. She exhibits no distension. There is no tenderness.  Musculoskeletal: She exhibits no edema.  R hand slightly contracted with fingers extended  Neurological: She is alert and oriented to person, place, and time. She exhibits abnormal muscle tone.  Fluent speech Negative Chvostek's sign Positive Trousseau's sign  Skin: Skin is warm and dry.  Psychiatric: She has a normal mood and affect. Judgment normal.  Nursing note and vitals reviewed.    ED Treatments / Results  Labs (all labs ordered are listed, but only abnormal results are displayed) Labs Reviewed  CBC WITH DIFFERENTIAL/PLATELET - Abnormal; Notable for the following:       Result Value   WBC 13.0 (*)    Hemoglobin 10.7 (*)    HCT 32.9 (*)    MCH 25.4 (*)    Neutro Abs 9.9 (*)    All other components within normal limits  BASIC METABOLIC PANEL - Abnormal; Notable for the following:    Potassium 2.7 (*)    Glucose, Bld 125 (*)    Calcium 8.1 (*)    All other components within normal limits  MAGNESIUM  CALCIUM, IONIZED    EKG  EKG Interpretation  Date/Time:  Saturday March 17 2016 09:46:48 EST Ventricular Rate:  83 PR Interval:    QRS Duration: 99 QT Interval:  381 QTC Calculation: 448 R Axis:   -41 Text Interpretation:  Sinus rhythm Probable left atrial enlargement Inferior infarct, old Consider anterior infarct Baseline wander in lead(s) V5 No significant change since last tracing Confirmed by LITTLE MD, RACHEL ([id number]) on 03/17/2016 9:55:15 AM       Radiology No results  found.  Procedures Procedures (including critical care time)  Medications Ordered in ED Medications  potassium chloride 10 mEq in 100 mL IVPB (10 mEq Intravenous New Bag/Given 03/17/16 1316)  calcium gluconate 1 g in sodium chloride 0.9 % 100 mL IVPB (not administered)  magnesium sulfate IVPB 2 g 50 mL (0 g Intravenous Stopped 03/17/16 1235)  potassium chloride SA (K-DUR,KLOR-CON) CR tablet 40 mEq (40 mEq Oral Given 03/17/16 1130)     Initial Impression / Assessment and Plan / ED Course  I have reviewed the triage vital signs and the nursing notes.  Pertinent labs that were available during my care of the patient were reviewed by me  and considered in my medical decision making (see chart for details).  Clinical Course     Pt 2d post-op from A total thyroidectomy presents with hand numbness that began overnight and hand contractions that began this morning. She was in no acute distress at presentation with reassuring vital signs. EKG unremarkable. No signs of infection at surgical wound. She did have positive Trousseau's sign. Obtained above labs Which showed potassium 2.7, calcium 8.1 which is similar to yesterday at time of discharge. Gave the patient oral and IV potassium repletion, IV calcium gluconate, and IV magnesium.  I discussed the patient's presentation and labs with general surgery, Dr. Carolynne Edouardoth, who felt that since the patient's calcium has been stable from yesterday she can continue supplementation at home if she is otherwise asymptomatic. I have recommended follow-up with her PCP on Monday for electrolyte recheck. I contacted pharmacy to discuss the best way to take her medications because levothyroxine needs to be separate from calcium by 4 hours. We have decided to have patient take calcium and morning at night and given thyroxine in the middle of the day. She understands this plan. Also provided with a few days of potassium supplementation. The patient will be discharged after  her IV runs are complete.  Final Clinical Impressions(s) / ED Diagnoses   Final diagnoses:  Hypocalcemia  Hypokalemia  Paresthesias  Involuntary muscle contractions    New Prescriptions New Prescriptions   POTASSIUM CHLORIDE (K-DUR) 10 MEQ TABLET    Take 1 tablet (10 mEq total) by mouth daily.     Laurence Spatesachel Morgan Little, MD 03/17/16 1520

## 2016-03-17 NOTE — ED Notes (Signed)
Patient denies pain and is resting comfortably.  

## 2016-03-17 NOTE — ED Notes (Signed)
ED Provider at bedside. 

## 2016-03-18 LAB — CALCIUM, IONIZED: CALCIUM, IONIZED, SERUM: 4.6 mg/dL (ref 4.5–5.6)

## 2016-03-19 ENCOUNTER — Encounter (HOSPITAL_COMMUNITY): Payer: Self-pay | Admitting: Surgery

## 2016-03-19 NOTE — Progress Notes (Signed)
Please contact patient and notify of benign pathology results.  Dory Verdun M. Shon Mansouri, MD, FACS [facility] Office: [phone]   

## 2016-05-16 DIAGNOSIS — R252 Cramp and spasm: Secondary | ICD-10-CM | POA: Diagnosis not present

## 2016-05-16 DIAGNOSIS — E039 Hypothyroidism, unspecified: Secondary | ICD-10-CM | POA: Diagnosis not present

## 2016-05-28 DIAGNOSIS — Z0000 Encounter for general adult medical examination without abnormal findings: Secondary | ICD-10-CM | POA: Diagnosis not present

## 2016-05-28 DIAGNOSIS — Z9109 Other allergy status, other than to drugs and biological substances: Secondary | ICD-10-CM | POA: Diagnosis not present

## 2016-05-28 DIAGNOSIS — E782 Mixed hyperlipidemia: Secondary | ICD-10-CM | POA: Diagnosis not present

## 2016-05-28 DIAGNOSIS — I10 Essential (primary) hypertension: Secondary | ICD-10-CM | POA: Diagnosis not present

## 2016-06-01 DIAGNOSIS — E039 Hypothyroidism, unspecified: Secondary | ICD-10-CM | POA: Diagnosis not present

## 2016-06-01 DIAGNOSIS — R7303 Prediabetes: Secondary | ICD-10-CM | POA: Diagnosis not present

## 2016-08-10 DIAGNOSIS — E038 Other specified hypothyroidism: Secondary | ICD-10-CM | POA: Diagnosis not present

## 2016-09-09 DIAGNOSIS — S8002XA Contusion of left knee, initial encounter: Secondary | ICD-10-CM | POA: Diagnosis not present

## 2016-09-09 DIAGNOSIS — S93692A Other sprain of left foot, initial encounter: Secondary | ICD-10-CM | POA: Diagnosis not present

## 2016-11-19 DIAGNOSIS — Z86010 Personal history of colonic polyps: Secondary | ICD-10-CM | POA: Diagnosis not present

## 2016-11-19 DIAGNOSIS — Z1211 Encounter for screening for malignant neoplasm of colon: Secondary | ICD-10-CM | POA: Diagnosis not present

## 2016-11-19 DIAGNOSIS — K5730 Diverticulosis of large intestine without perforation or abscess without bleeding: Secondary | ICD-10-CM | POA: Diagnosis not present

## 2016-11-27 DIAGNOSIS — Z1231 Encounter for screening mammogram for malignant neoplasm of breast: Secondary | ICD-10-CM | POA: Diagnosis not present

## 2016-11-28 DIAGNOSIS — E782 Mixed hyperlipidemia: Secondary | ICD-10-CM | POA: Diagnosis not present

## 2016-11-28 DIAGNOSIS — K219 Gastro-esophageal reflux disease without esophagitis: Secondary | ICD-10-CM | POA: Diagnosis not present

## 2016-11-28 DIAGNOSIS — I10 Essential (primary) hypertension: Secondary | ICD-10-CM | POA: Diagnosis not present

## 2016-11-28 DIAGNOSIS — R7303 Prediabetes: Secondary | ICD-10-CM | POA: Diagnosis not present

## 2016-11-28 DIAGNOSIS — E039 Hypothyroidism, unspecified: Secondary | ICD-10-CM | POA: Diagnosis not present

## 2017-01-18 DIAGNOSIS — B351 Tinea unguium: Secondary | ICD-10-CM | POA: Diagnosis not present

## 2017-01-31 DIAGNOSIS — Z23 Encounter for immunization: Secondary | ICD-10-CM | POA: Diagnosis not present

## 2017-06-03 DIAGNOSIS — R7303 Prediabetes: Secondary | ICD-10-CM | POA: Diagnosis not present

## 2017-06-03 DIAGNOSIS — Z23 Encounter for immunization: Secondary | ICD-10-CM | POA: Diagnosis not present

## 2017-06-03 DIAGNOSIS — E782 Mixed hyperlipidemia: Secondary | ICD-10-CM | POA: Diagnosis not present

## 2017-06-03 DIAGNOSIS — Z0000 Encounter for general adult medical examination without abnormal findings: Secondary | ICD-10-CM | POA: Diagnosis not present

## 2017-06-03 DIAGNOSIS — I10 Essential (primary) hypertension: Secondary | ICD-10-CM | POA: Diagnosis not present

## 2017-07-05 DIAGNOSIS — M25569 Pain in unspecified knee: Secondary | ICD-10-CM | POA: Diagnosis not present

## 2017-08-19 DIAGNOSIS — M170 Bilateral primary osteoarthritis of knee: Secondary | ICD-10-CM | POA: Diagnosis not present

## 2017-08-29 DIAGNOSIS — Z23 Encounter for immunization: Secondary | ICD-10-CM | POA: Diagnosis not present

## 2017-09-02 DIAGNOSIS — R7303 Prediabetes: Secondary | ICD-10-CM | POA: Diagnosis not present

## 2017-09-02 DIAGNOSIS — E039 Hypothyroidism, unspecified: Secondary | ICD-10-CM | POA: Diagnosis not present

## 2017-09-02 DIAGNOSIS — E669 Obesity, unspecified: Secondary | ICD-10-CM | POA: Diagnosis not present

## 2017-11-28 DIAGNOSIS — Z1231 Encounter for screening mammogram for malignant neoplasm of breast: Secondary | ICD-10-CM | POA: Diagnosis not present

## 2017-12-15 IMAGING — DX DG CHEST 2V
2 series · 2 of 2 positions shown · non-contrast
Comparison: 09/19/2014 and 04/25/2010.

CLINICAL DATA: 55-year-old female preop for thyroid surgery
03/15/2016. No chest complaints. Hypertensive. Nonsmoker. Initial
encounter.

EXAM:
CHEST  2 VIEW

[chest pa]
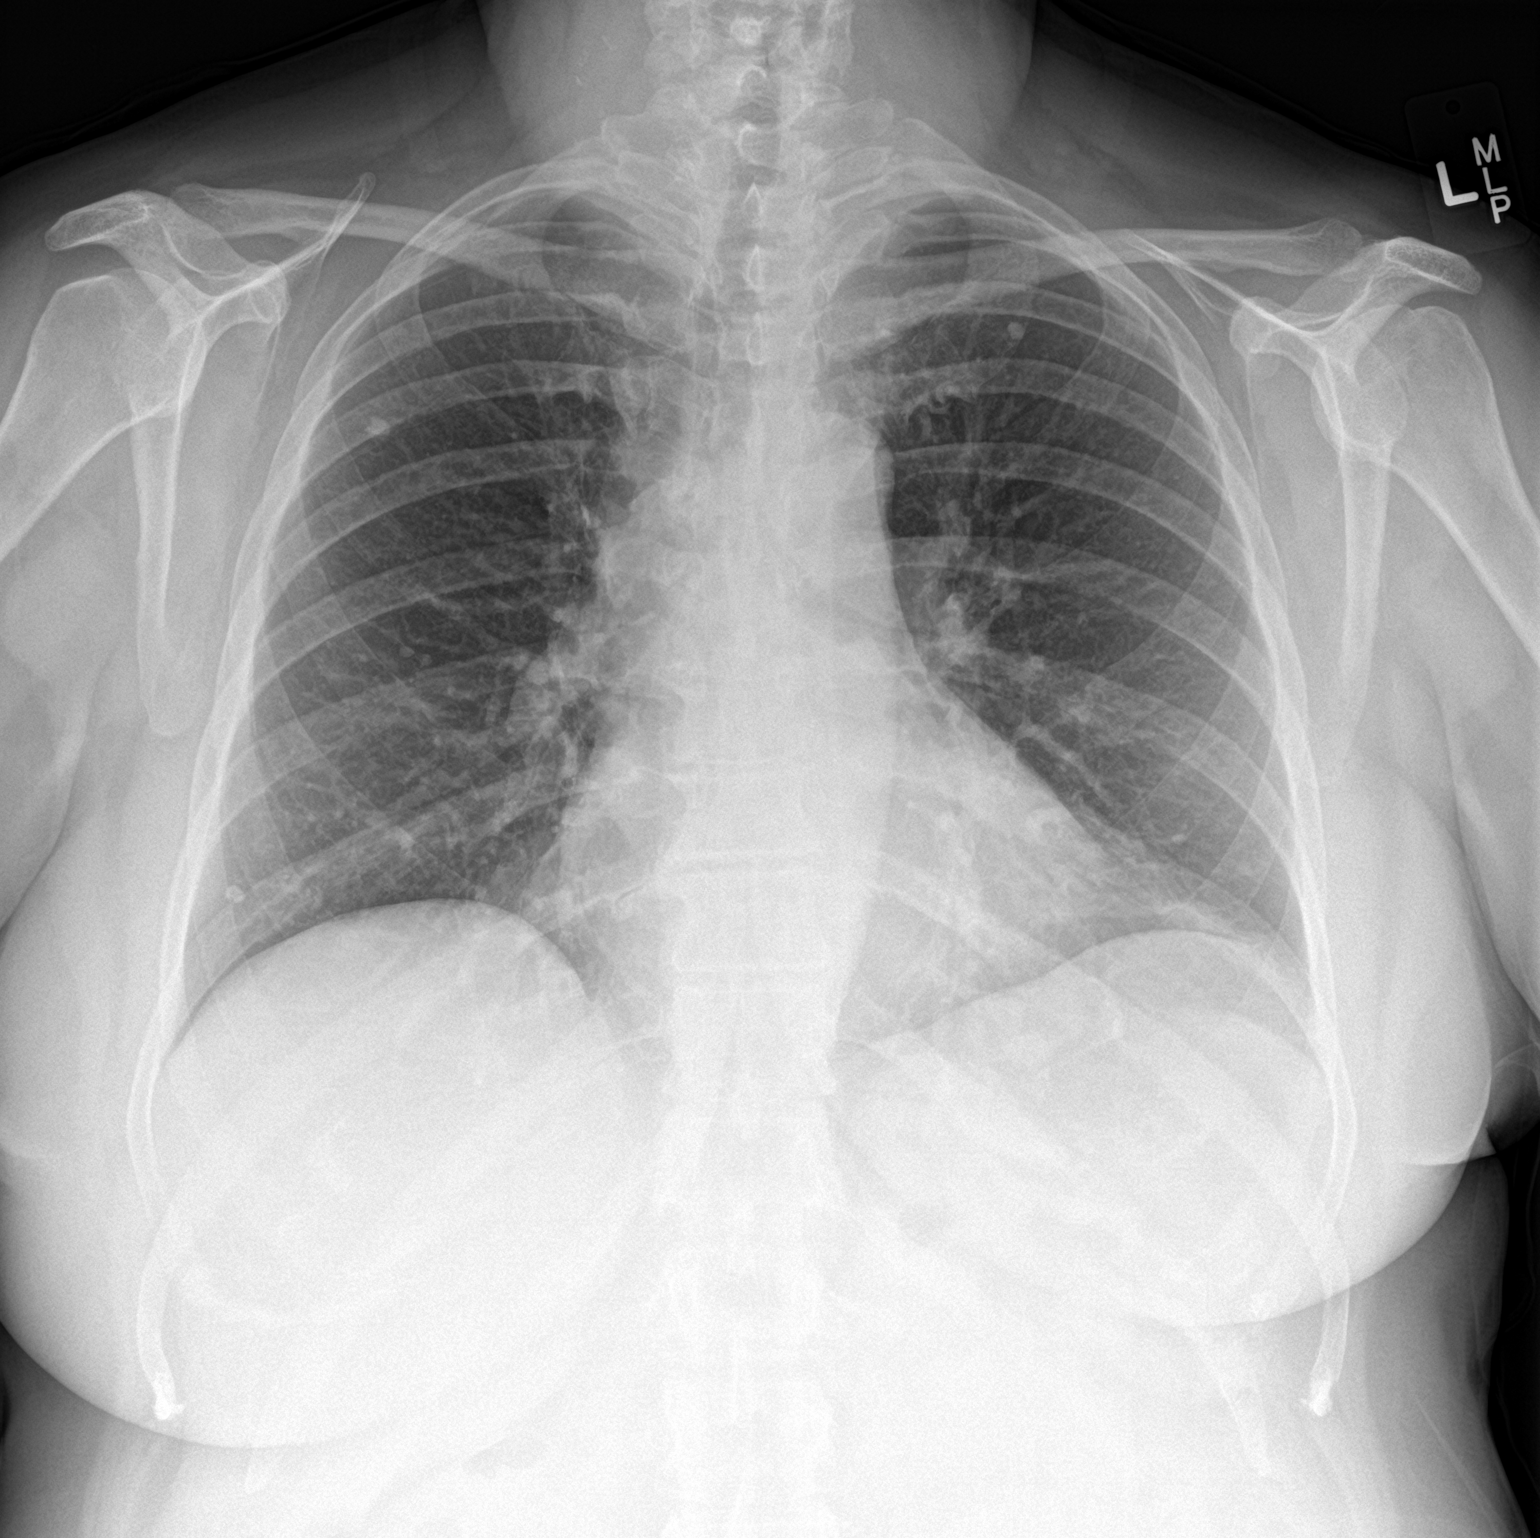

[chest lat]
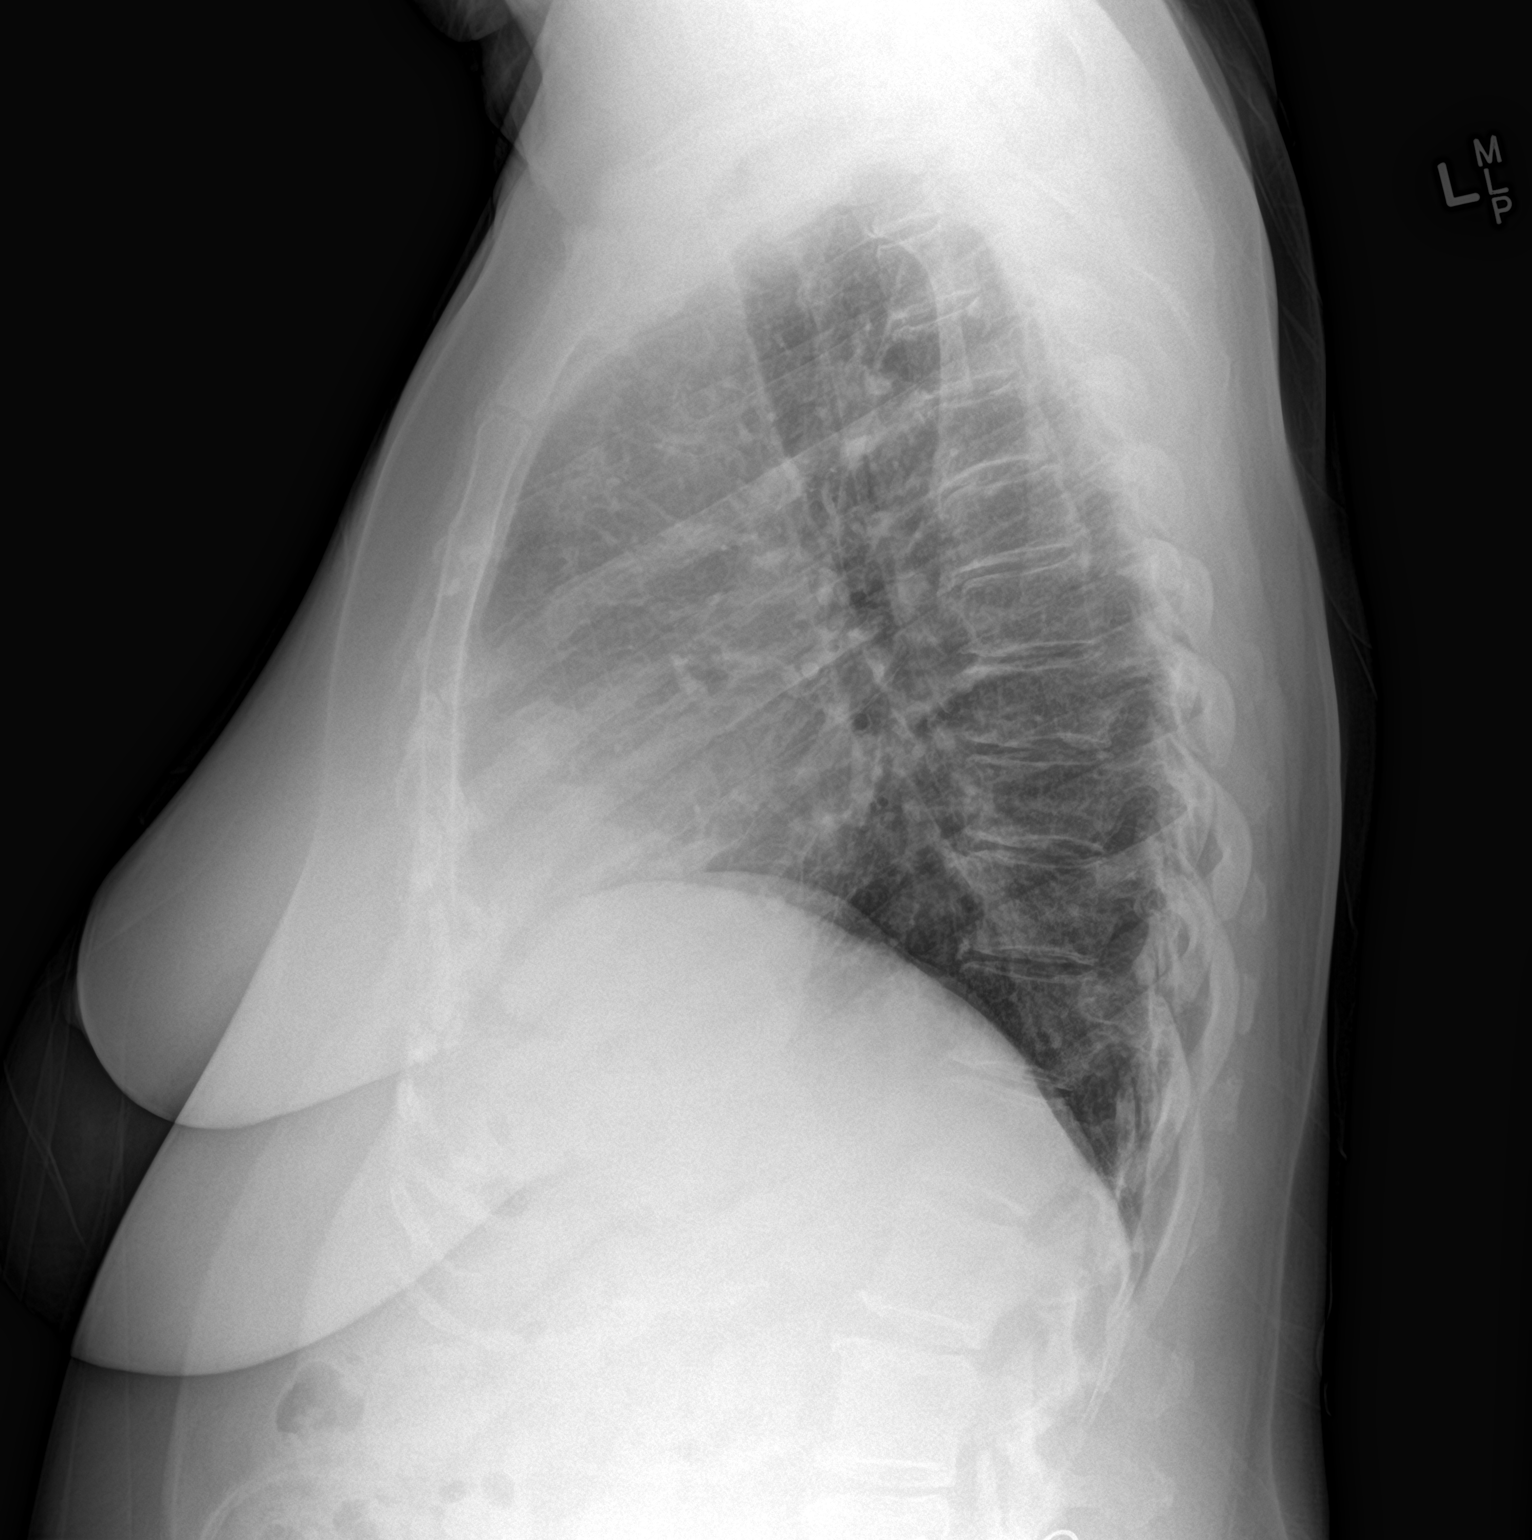

[2 of 2 positions shown; findings below may reference images not displayed]

FINDINGS: Scattered calcified granulomas bilaterally without change.

Central pulmonary vascular prominence stable without infiltrate,
congestive heart failure or pneumothorax.

Scarring left base stable.

No plain film evidence of pulmonary malignancy.

Mild cardiomegaly.

Slightly tortuous aorta.

No acute bony abnormality.

Question carotid bifurcation calcifications.
IMPRESSION: Scattered calcified granulomas bilaterally without change.

No acute pulmonary abnormality.

Mild cardiomegaly.

Slightly tortuous aorta.

Question carotid bifurcation calcifications.

## 2018-01-19 DIAGNOSIS — R21 Rash and other nonspecific skin eruption: Secondary | ICD-10-CM | POA: Diagnosis not present

## 2018-02-18 DIAGNOSIS — R7303 Prediabetes: Secondary | ICD-10-CM | POA: Diagnosis not present

## 2018-02-18 DIAGNOSIS — E039 Hypothyroidism, unspecified: Secondary | ICD-10-CM | POA: Diagnosis not present

## 2018-02-18 DIAGNOSIS — Z23 Encounter for immunization: Secondary | ICD-10-CM | POA: Diagnosis not present

## 2018-02-18 DIAGNOSIS — E782 Mixed hyperlipidemia: Secondary | ICD-10-CM | POA: Diagnosis not present

## 2018-02-18 DIAGNOSIS — I10 Essential (primary) hypertension: Secondary | ICD-10-CM | POA: Diagnosis not present

## 2018-04-25 DIAGNOSIS — L309 Dermatitis, unspecified: Secondary | ICD-10-CM | POA: Diagnosis not present

## 2018-04-25 DIAGNOSIS — E039 Hypothyroidism, unspecified: Secondary | ICD-10-CM | POA: Diagnosis not present

## 2018-04-25 DIAGNOSIS — M79641 Pain in right hand: Secondary | ICD-10-CM | POA: Diagnosis not present

## 2018-06-16 ENCOUNTER — Other Ambulatory Visit: Payer: Self-pay | Admitting: Family Medicine

## 2018-06-16 ENCOUNTER — Other Ambulatory Visit (HOSPITAL_COMMUNITY)
Admission: RE | Admit: 2018-06-16 | Discharge: 2018-06-16 | Disposition: A | Discharge status: Home / Self Care | Payer: 59 | Source: Ambulatory Visit | Attending: Family Medicine | Admitting: Family Medicine

## 2018-06-16 DIAGNOSIS — K219 Gastro-esophageal reflux disease without esophagitis: Secondary | ICD-10-CM | POA: Diagnosis not present

## 2018-06-16 DIAGNOSIS — Z01411 Encounter for gynecological examination (general) (routine) with abnormal findings: Secondary | ICD-10-CM | POA: Insufficient documentation

## 2018-06-16 DIAGNOSIS — R7303 Prediabetes: Secondary | ICD-10-CM | POA: Diagnosis not present

## 2018-06-16 DIAGNOSIS — Z0000 Encounter for general adult medical examination without abnormal findings: Secondary | ICD-10-CM | POA: Diagnosis not present

## 2018-06-16 DIAGNOSIS — E039 Hypothyroidism, unspecified: Secondary | ICD-10-CM | POA: Diagnosis not present

## 2018-06-16 DIAGNOSIS — E782 Mixed hyperlipidemia: Secondary | ICD-10-CM | POA: Diagnosis not present

## 2018-06-16 DIAGNOSIS — I10 Essential (primary) hypertension: Secondary | ICD-10-CM | POA: Diagnosis not present

## 2018-06-17 LAB — CYTOLOGY - PAP
Diagnosis: NEGATIVE
HPV: NOT DETECTED

## 2018-09-28 DIAGNOSIS — G8929 Other chronic pain: Secondary | ICD-10-CM | POA: Diagnosis not present

## 2018-09-28 DIAGNOSIS — M25561 Pain in right knee: Secondary | ICD-10-CM | POA: Diagnosis not present

## 2018-09-28 DIAGNOSIS — M25461 Effusion, right knee: Secondary | ICD-10-CM | POA: Diagnosis not present

## 2023-08-21 ENCOUNTER — Other Ambulatory Visit: Payer: Self-pay

## 2023-08-21 ENCOUNTER — Encounter (HOSPITAL_BASED_OUTPATIENT_CLINIC_OR_DEPARTMENT_OTHER): Payer: Self-pay | Admitting: Emergency Medicine

## 2023-08-21 ENCOUNTER — Emergency Department (HOSPITAL_BASED_OUTPATIENT_CLINIC_OR_DEPARTMENT_OTHER)
Admission: EM | Admit: 2023-08-21 | Discharge: 2023-08-21 | Disposition: A | Discharge status: Home / Self Care | Attending: Emergency Medicine | Admitting: Emergency Medicine

## 2023-08-21 DIAGNOSIS — I10 Essential (primary) hypertension: Secondary | ICD-10-CM | POA: Insufficient documentation

## 2023-08-21 DIAGNOSIS — R197 Diarrhea, unspecified: Secondary | ICD-10-CM | POA: Diagnosis present

## 2023-08-21 LAB — URINALYSIS, ROUTINE W REFLEX MICROSCOPIC
Bilirubin Urine: NEGATIVE
Glucose, UA: NEGATIVE mg/dL
Hgb urine dipstick: NEGATIVE
Ketones, ur: NEGATIVE mg/dL
Leukocytes,Ua: NEGATIVE
Nitrite: NEGATIVE
Protein, ur: NEGATIVE mg/dL
Specific Gravity, Urine: 1.02 (ref 1.005–1.030)
pH: 5.5 (ref 5.0–8.0)

## 2023-08-21 LAB — COMPREHENSIVE METABOLIC PANEL WITH GFR
ALT: 22 U/L (ref 0–44)
AST: 20 U/L (ref 15–41)
Albumin: 3.8 g/dL (ref 3.5–5.0)
Alkaline Phosphatase: 61 U/L (ref 38–126)
Anion gap: 10 (ref 5–15)
BUN: 13 mg/dL (ref 8–23)
CO2: 26 mmol/L (ref 22–32)
Calcium: 8 mg/dL — ABNORMAL LOW (ref 8.9–10.3)
Chloride: 102 mmol/L (ref 98–111)
Creatinine, Ser: 0.86 mg/dL (ref 0.44–1.00)
GFR, Estimated: >60 mL/min (ref >60)
Glucose, Bld: 99 mg/dL (ref 70–99)
Potassium: 3.9 mmol/L (ref 3.5–5.1)
Sodium: 138 mmol/L (ref 135–145)
Total Bilirubin: <0.2 mg/dL (ref 0.0–1.2)
Total Protein: 7.3 g/dL (ref 6.5–8.1)

## 2023-08-21 LAB — CBC
HCT: 39.1 % (ref 36.0–46.0)
Hemoglobin: 13.6 g/dL (ref 12.0–15.0)
MCH: 29.9 pg (ref 26.0–34.0)
MCHC: 34.8 g/dL (ref 30.0–36.0)
MCV: 85.9 fL (ref 80.0–100.0)
Platelets: 336 10*3/uL (ref 150–400)
RBC: 4.55 MIL/uL (ref 3.87–5.11)
RDW: 13.2 % (ref 11.5–15.5)
WBC: 11.1 10*3/uL — ABNORMAL HIGH (ref 4.0–10.5)
nRBC: 0 % (ref 0.0–0.2)

## 2023-08-21 LAB — RESP PANEL BY RT-PCR (RSV, FLU A&B, COVID)  RVPGX2
Influenza A by PCR: NEGATIVE
Influenza B by PCR: NEGATIVE
Resp Syncytial Virus by PCR: NEGATIVE
SARS Coronavirus 2 by RT PCR: NEGATIVE

## 2023-08-21 LAB — LIPASE, BLOOD: Lipase: 35 U/L (ref 11–51)

## 2023-08-21 MED ORDER — SODIUM CHLORIDE 0.9 % IV BOLUS
1000.0000 mL | Freq: Once | INTRAVENOUS | Status: AC
  Administered 2023-08-21: 1000 mL via INTRAVENOUS

## 2023-08-21 NOTE — ED Provider Notes (Signed)
 [place] [facility] Provider Note   CSN: [id number] Arrival date & time: 08/21/23  1548     History  Chief Complaint  Patient presents with   Diarrhea    Tara Hill is a 63 y.o. female.  Patient complains of diarrhea since last p.m.  Patient reports last episode was around 1:00 today.  Patient complains of feeling weak and feeling like she is dehydrated.  Patient reports that last night she had a near syncopal episode fell and hit her knee and her arm.  Patient complains of bruising to her knee and her arm she does not feel like she has anything broken.  Patient reports that she did not hit her head.  Patient denies any fever or chills she has not had any vomiting.  Patient denies any burning with urination.  Patient has a past medical history of hypertension.  Patient reports that she sometimes has problems with food intolerance and thought that this was caused by something that she ate.  The history is provided by the patient. No language interpreter was used.  Diarrhea      Home Medications Prior to Admission medications   Medication Sig Start Date End Date Taking? Authorizing Provider  atenolol  (TENORMIN ) 25 MG tablet Take 25 mg by mouth daily.    [provider]  calcium  carbonate (TUMS) 500 MG chewable tablet Chew 3 tablets (600 mg of elemental calcium  total) by mouth 3 (three) times daily. 03/16/16   Oralee Billow, MD  cetirizine (ZYRTEC) 10 MG tablet Take 10 mg by mouth daily.    [provider]  hydrochlorothiazide (HYDRODIURIL) 25 MG tablet Take 25 mg by mouth daily.    [provider]  HYDROcodone -acetaminophen  (NORCO/VICODIN) 5-325 MG tablet Take 1-2 tablets by mouth every 4 (four) hours as needed for moderate pain. 03/16/16   Oralee Billow, MD  omeprazole (PRILOSEC) 40 MG capsule Take 40 mg by mouth daily.    [provider]  potassium chloride  (K-DUR) 10 MEQ tablet Take 1 tablet (10 mEq total)  by mouth daily. 03/17/16   Little, Hebert Littler, MD  SYNTHROID  100 MCG tablet Take 1 tablet (100 mcg total) by mouth daily. 03/16/16   Oralee Billow, MD  triamterene -hydrochlorothiazide (MAXZIDE-25) 37.5-25 MG per tablet Take 1 tablet by mouth daily. Patient not taking: Reported on 03/08/2016 11/26/14   Dain Drown, MD      Allergies    Patient has no known allergies.    Review of Systems   Review of Systems  Gastrointestinal:  Positive for diarrhea.  All other systems reviewed and are negative.   Physical Exam Updated Vital Signs BP 134/86   Pulse 65   Temp 97.6 F (36.4 C) (Oral)   Resp 17   Wt 107.5 kg   LMP 03/04/2016   SpO2 100%   BMI 39.44 kg/m  Physical Exam Vitals and nursing note reviewed.  Constitutional:      Appearance: She is well-developed.  HENT:     Head: Normocephalic.  Cardiovascular:     Rate and Rhythm: Normal rate.  Pulmonary:     Effort: Pulmonary effort is normal.  Abdominal:     General: Abdomen is flat. There is no distension.     Palpations: Abdomen is soft.     Tenderness: There is no abdominal tenderness.  Musculoskeletal:        General: Normal range of motion.     Cervical back: Normal range of motion.  Skin:  General: Skin is warm.  Neurological:     General: No focal deficit present.     Mental Status: She is alert and oriented to person, place, and time.     ED Results / Procedures / Treatments   Labs (all labs ordered are listed, but only abnormal results are displayed) Labs Reviewed  COMPREHENSIVE METABOLIC PANEL WITH GFR - Abnormal; Notable for the following components:      Result Value   Calcium  8.0 (*)    All other components within normal limits  CBC - Abnormal; Notable for the following components:   WBC 11.1 (*)    All other components within normal limits  RESP PANEL BY RT-PCR (RSV, FLU A&B, COVID)  [id number]  LIPASE, BLOOD  URINALYSIS, ROUTINE W REFLEX MICROSCOPIC    EKG None  Radiology No results  found.  Procedures Procedures    Medications Ordered in ED Medications  sodium chloride  0.9 % bolus 1,000 mL (1,000 mLs Intravenous New Bag/Given 08/21/23 2011)    ED Course/ Medical Decision Making/ A&P                                 Medical Decision Making Patient complains of diarrhea since yesterday.  Patient reports last episode was several hours ago.  Amount and/or Complexity of Data Reviewed Labs: ordered. Decision-making details documented in ED Course.    Details: Labs ordered reviewed and interpreted.  Potassium is normal  Risk Risk Details: Patient given IV fluids x 1 L.  Patient tolerating sips of fluid without vomiting.  Patient advised to continue oral hydration.  Patient is advised to follow-up with her primary care physician.           Final Clinical Impression(s) / ED Diagnoses Final diagnoses:  Diarrhea, unspecified type    Rx / DC Orders ED Discharge Orders     None     An After Visit Summary was printed and given to the patient.     Sandi Crosby, PA-C 08/21/23 [id number]    Owen Blowers P, DO 08/25/23 1426

## 2023-08-21 NOTE — ED Notes (Signed)
 Orders acknowledged were done in triage

## 2023-08-21 NOTE — ED Triage Notes (Signed)
 Woke up last night with diarrhea , negative home test for covid . Feels dehydrated. Nausea yet no emesis .

## 2023-08-21 NOTE — ED Notes (Signed)
 Patient denies pain and is resting comfortably.
